# Patient Record
Sex: Male | Born: 1945 | Race: Black or African American | Hispanic: No | Marital: Married | Smoking: Former smoker
Health system: Southern US, Community
[De-identification: ages and names within clinical notes are randomized; demographics above are authoritative.]

## PROBLEM LIST (undated history)

## (undated) DIAGNOSIS — C140 Malignant neoplasm of pharynx, unspecified: Secondary | ICD-10-CM

## (undated) DIAGNOSIS — E119 Type 2 diabetes mellitus without complications: Secondary | ICD-10-CM

## (undated) DIAGNOSIS — J439 Emphysema, unspecified: Secondary | ICD-10-CM

## (undated) DIAGNOSIS — I10 Essential (primary) hypertension: Secondary | ICD-10-CM

---

## 2014-03-28 ENCOUNTER — Inpatient Hospital Stay (HOSPITAL_BASED_OUTPATIENT_CLINIC_OR_DEPARTMENT_OTHER)
Admission: EM | Admit: 2014-03-28 | Discharge: 2014-04-04 | DRG: 871 | Disposition: A | Discharge status: Home / Self Care | Payer: Medicare Other | Attending: Emergency Medicine | Admitting: Emergency Medicine

## 2014-03-28 ENCOUNTER — Encounter (HOSPITAL_BASED_OUTPATIENT_CLINIC_OR_DEPARTMENT_OTHER): Payer: Self-pay | Admitting: Emergency Medicine

## 2014-03-28 DIAGNOSIS — J9690 Respiratory failure, unspecified, unspecified whether with hypoxia or hypercapnia: Secondary | ICD-10-CM | POA: Diagnosis present

## 2014-03-28 DIAGNOSIS — Z23 Encounter for immunization: Secondary | ICD-10-CM

## 2014-03-28 DIAGNOSIS — E860 Dehydration: Secondary | ICD-10-CM | POA: Diagnosis present

## 2014-03-28 DIAGNOSIS — J9692 Respiratory failure, unspecified with hypercapnia: Secondary | ICD-10-CM

## 2014-03-28 DIAGNOSIS — J441 Chronic obstructive pulmonary disease with (acute) exacerbation: Secondary | ICD-10-CM | POA: Diagnosis present

## 2014-03-28 DIAGNOSIS — J439 Emphysema, unspecified: Secondary | ICD-10-CM

## 2014-03-28 DIAGNOSIS — Z87891 Personal history of nicotine dependence: Secondary | ICD-10-CM

## 2014-03-28 DIAGNOSIS — R0602 Shortness of breath: Secondary | ICD-10-CM | POA: Diagnosis not present

## 2014-03-28 DIAGNOSIS — R6520 Severe sepsis without septic shock: Secondary | ICD-10-CM

## 2014-03-28 DIAGNOSIS — E1169 Type 2 diabetes mellitus with other specified complication: Secondary | ICD-10-CM | POA: Diagnosis present

## 2014-03-28 DIAGNOSIS — A419 Sepsis, unspecified organism: Secondary | ICD-10-CM | POA: Diagnosis not present

## 2014-03-28 DIAGNOSIS — Z85819 Personal history of malignant neoplasm of unspecified site of lip, oral cavity, and pharynx: Secondary | ICD-10-CM

## 2014-03-28 DIAGNOSIS — I498 Other specified cardiac arrhythmias: Secondary | ICD-10-CM | POA: Diagnosis present

## 2014-03-28 DIAGNOSIS — N179 Acute kidney failure, unspecified: Secondary | ICD-10-CM | POA: Diagnosis not present

## 2014-03-28 DIAGNOSIS — D649 Anemia, unspecified: Secondary | ICD-10-CM | POA: Diagnosis present

## 2014-03-28 DIAGNOSIS — E872 Acidosis, unspecified: Secondary | ICD-10-CM | POA: Diagnosis present

## 2014-03-28 DIAGNOSIS — J852 Abscess of lung without pneumonia: Secondary | ICD-10-CM | POA: Diagnosis present

## 2014-03-28 DIAGNOSIS — J9621 Acute and chronic respiratory failure with hypoxia: Secondary | ICD-10-CM

## 2014-03-28 DIAGNOSIS — J9691 Respiratory failure, unspecified with hypoxia: Secondary | ICD-10-CM

## 2014-03-28 DIAGNOSIS — J9620 Acute and chronic respiratory failure, unspecified whether with hypoxia or hypercapnia: Secondary | ICD-10-CM | POA: Diagnosis present

## 2014-03-28 DIAGNOSIS — T380X5A Adverse effect of glucocorticoids and synthetic analogues, initial encounter: Secondary | ICD-10-CM | POA: Diagnosis present

## 2014-03-28 DIAGNOSIS — G9340 Encephalopathy, unspecified: Secondary | ICD-10-CM | POA: Diagnosis present

## 2014-03-28 DIAGNOSIS — J9601 Acute respiratory failure with hypoxia: Secondary | ICD-10-CM

## 2014-03-28 DIAGNOSIS — J189 Pneumonia, unspecified organism: Secondary | ICD-10-CM

## 2014-03-28 DIAGNOSIS — E861 Hypovolemia: Secondary | ICD-10-CM | POA: Diagnosis not present

## 2014-03-28 DIAGNOSIS — I10 Essential (primary) hypertension: Secondary | ICD-10-CM | POA: Diagnosis present

## 2014-03-28 HISTORY — DX: Essential (primary) hypertension: I10

## 2014-03-28 HISTORY — DX: Malignant neoplasm of pharynx, unspecified: C14.0

## 2014-03-28 HISTORY — DX: Type 2 diabetes mellitus without complications: E11.9

## 2014-03-28 HISTORY — DX: Emphysema, unspecified: J43.9

## 2014-03-28 NOTE — ED Provider Notes (Addendum)
CSN: [id number]     Arrival date & time 03/28/14  2332 History  This chart was scribed for Varney Biles, MD by Einar Pheasant, ED Scribe. This patient was seen in room MH10/MH10 and the patient's care was started at 11:47 PM.    Chief Complaint  Patient presents with  . Shortness of Breath   The history is provided by the patient. No language interpreter was used.   HPI Comments:  Brent Powers is a 68 y.o. male with hx of DM, COPD, HTN presents to the Emergency Department complaining of gradual onset SOB that started 1 week ago. Pt reports using oxygen at night DUE TO HIS advanced Emphysema. He is also endorsing a productive cough with brown colored sputum. Pt states that he has no trouble getting around at home, but today he noticed that it was hard to just get to the door. He states that lately he has not been able to sleep flat, secondary to the DIB. Pt is here visiting from [place], [place]. Denies any hx of blood blots, hx of CA, palpitations, chest pain, or sick contacts. He denies using a cpap machine or having intubations.  At arrival, pt had O2 sats in the 40s, which improved to 60s on 3 Liters O2 and eventually 100% on NRB.   Past Medical History  Diagnosis Date  . Emphysema lung   . Diabetes type 2, controlled   . Hypertension    History reviewed. No pertinent past surgical history. History reviewed. No pertinent family history. History  Substance Use Topics  . Smoking status: Former Research scientist (life sciences)  . Smokeless tobacco: Not on file  . Alcohol Use: No    Review of Systems  Constitutional: Negative for fever and chills.  HENT: Negative for rhinorrhea, sinus pressure, sneezing and sore throat.   Respiratory: Positive for cough and shortness of breath.   Cardiovascular: Negative for chest pain and palpitations.  Gastrointestinal: Negative for nausea and vomiting.  Musculoskeletal: Negative for myalgias.  Skin: Negative for rash.  Allergic/Immunologic: Negative for  immunocompromised state.  Neurological: Negative for weakness.  Psychiatric/Behavioral: Negative for confusion.   Allergies  Review of patient's allergies indicates no known allergies.  Home Medications   Prior to Admission medications   Medication Sig Start Date End Date Taking? Authorizing Provider  amoxicillin (AMOXIL) 500 MG capsule Take 1,000 mg by mouth 2 (two) times daily.   Yes Historical Provider, MD  Fluticasone-Salmeterol (ADVAIR) 250-50 MCG/DOSE AEPB Inhale 1 puff into the lungs 2 (two) times daily.   Yes Historical Provider, MD  levocetirizine (XYZAL) 5 MG tablet Take 5 mg by mouth every evening.   Yes Historical Provider, MD  lisinopril (PRINIVIL,ZESTRIL) 10 MG tablet Take 10 mg by mouth daily.   Yes Historical Provider, MD  metFORMIN (GLUCOPHAGE) 1000 MG tablet Take 1,000 mg by mouth 2 (two) times daily with a meal.   Yes Historical Provider, MD  tiotropium (SPIRIVA) 18 MCG inhalation capsule Place 18 mcg into inhaler and inhale daily.   Yes Historical Provider, MD   Triage Vitals:BP 151/78  Pulse 143  Temp(Src) 99.2 F (37.3 C) (Oral)  Resp 44  Ht 6' (1.829 m)  Wt 230 lb (104.327 kg)  BMI 31.19 kg/m2  SpO2 55%   Physical Exam  Nursing note and vitals reviewed. Constitutional: He is oriented to person, place, and time. He appears well-developed and well-nourished. No distress.  HENT:  Head: Normocephalic and atraumatic.  Eyes: Conjunctivae and EOM are normal.  Neck: Neck supple. No  tracheal deviation present.  Cardiovascular: Regular rhythm.  Tachycardia present.   Pulmonary/Chest: He is in respiratory distress ( mild). He has no wheezes. He has no rales.  Pt is in mild respiratory distress. Mild accessory muscle use. Pt is tachypneic - 28. No wheezes appreciated. No rhonchi. No rales. + poor air entry  Abdominal: Soft. Bowel sounds are normal.  Musculoskeletal: Normal range of motion. He exhibits no edema and no tenderness.  No unilateral leg swelling of calf  tenderness.  Neurological: He is alert and oriented to person, place, and time.  Skin: Skin is warm and dry.  Psychiatric: He has a normal mood and affect. His behavior is normal.    ED Course  Procedures (including critical care time)  DIAGNOSTIC STUDIES: Oxygen Saturation is 55% on RA, low by my interpretation.    COORDINATION OF CARE: 11:48 PM- Pt advised of plan for treatment and pt agrees.  Labs Review Labs Reviewed  CBC WITH DIFFERENTIAL - Abnormal; Notable for the following:    WBC 17.2 (*)    RBC 4.17 (*)    Hemoglobin 12.7 (*)    HCT 36.6 (*)    Neutrophils Relative % 78 (*)    Lymphocytes Relative 4 (*)    Monocytes Relative 2 (*)    Band Neutrophils 16 (*)    Neutro Abs 16.2 (*)    All other components within normal limits  BASIC METABOLIC PANEL - Abnormal; Notable for the following:    Sodium 134 (*)    Chloride 92 (*)    Glucose, Bld 194 (*)    BUN 36 (*)    Creatinine, Ser 1.50 (*)    GFR calc non Af Amer 46 (*)    GFR calc Af Amer 53 (*)    Anion gap 18 (*)    All other components within normal limits  PRO B NATRIURETIC PEPTIDE - Abnormal; Notable for the following:    Pro B Natriuretic peptide (BNP) 1730.0 (*)    All other components within normal limits  URINALYSIS, ROUTINE W REFLEX MICROSCOPIC - Abnormal; Notable for the following:    APPearance CLOUDY (*)    Hgb urine dipstick MODERATE (*)    Ketones, ur 15 (*)    Protein, ur 100 (*)    All other components within normal limits  URINE MICROSCOPIC-ADD ON - Abnormal; Notable for the following:    Bacteria, UA FEW (*)    All other components within normal limits  I-STAT ARTERIAL BLOOD GAS, ED - Abnormal; Notable for the following:    pH, Arterial 7.269 (*)    pCO2 arterial 52.5 (*)    pO2, Arterial 102.0 (*)    Bicarbonate 24.1 (*)    Acid-base deficit 3.0 (*)    All other components within normal limits  I-STAT ARTERIAL BLOOD GAS, ED - Abnormal; Notable for the following:    pH, Arterial  7.229 (*)    pCO2 arterial 57.6 (*)    pO2, Arterial 168.0 (*)    Bicarbonate 24.1 (*)    Acid-base deficit 4.0 (*)    All other components within normal limits  TROPONIN I  BLOOD GAS, ARTERIAL  PATHOLOGIST SMEAR REVIEW  I-STAT CG4 LACTIC ACID, ED    Imaging Review Ct Angio Chest Pe W/cm &/or Wo Cm  03/29/2014   CLINICAL DATA:  Evaluate for PE, parenchymal disease.  EXAM: CT ANGIOGRAPHY CHEST WITH CONTRAST  TECHNIQUE: Multidetector CT imaging of the chest was performed using the standard protocol during bolus administration  of intravenous contrast. Multiplanar CT image reconstructions and MIPs were obtained to evaluate the vascular anatomy.  CONTRAST:  53mL OMNIPAQUE IOHEXOL 350 MG/ML SOLN  COMPARISON:  Prior radiograph from earlier the same day.  FINDINGS: Thyroid gland within normal limits.  Enlarged 1.5 cm AE recess node present (series 4, image 39). Precarinal node measures 1 cm (series 4, image 43). No hilar or axillary adenopathy.  Intrathoracic aorta of normal caliber. Great vessels within normal limits.  Heart size normal.  No pericardial effusion.  Pulmonary arterial tree is well opacified. No filling defect to suggest acute pulmonary embolism identified. Re-formatted imaging confirms these findings. Please note that evaluation for possible small distal emboli somewhat limited due to respiratory motion artifact on this exam.  Severe upper lobe predominant emphysema present. There is superimposed extensive parenchymal infiltrate throughout the left upper lobe, greatest within the left lung apex. Findings are most consistent with pneumonia. No cavitation. The left lower lobe and right lung are clear. No pleural effusion. No pneumothorax. No worrisome pulmonary nodule or mass.  Visualized upper abdomen is within normal limits.  No acute osseous abnormality. No worrisome lytic or blastic osseous lesions. Gynecomastia noted.  IMPRESSION: 1. No CT evidence of acute pulmonary embolism. Please note  that evaluation of the distal segmental pulmonary arteries is fairly limited on this exam due to extensive respiratory motion artifact. 2. Severe emphysema with superimposed left upper lobe pneumonia. No cavitary changes to suggest post - primary tuberculous infection, although this is not entirely excluded. 3. Mediastinal adenopathy is above, likely reactive in nature. Results were discussed by telephone at the time of interpretation on 03/29/2014 at 3:35 am to Dr. Varney Biles , who verbally acknowledged these results.   Electronically Signed   By: Jeannine Boga M.D.   On: 03/29/2014 03:34   Dg Chest Portable 1 View  03/29/2014   CLINICAL DATA:  Shortness of breath. History of emphysema, diabetes. History of smoking.  EXAM: PORTABLE CHEST - 1 VIEW  COMPARISON:  None.  FINDINGS: Heart size is within normal limits accounting for the portable position of the patient. There is diffuse interstitial infiltrate throughout the left lung. More focal opacity at the left apex is noted. Right lung is essentially clear. No pulmonary edema.  IMPRESSION: Interstitial infiltrate throughout the left lung with left apical confluence. Tuberculosis needs to be excluded. Consider CT the chest with contrast for further evaluation.  The findings were discussed with Abi Shoults on 03/29/2014 at 1:30 am.   Electronically Signed   By: Shon Hale M.D.   On: 03/29/2014 01:31     EKG Interpretation   Date/Time:  Thursday March 29 2014 00:32:31 EDT Ventricular Rate:  144 PR Interval:  142 QRS Duration: 80 QT Interval:  248 QTC Calculation: 383 R Axis:   -36 Text Interpretation:  Sinus tachycardia Left axis deviation Left  ventricular hypertrophy Nonspecific ST abnormality Abnormal ECG poor r  wave progression Confirmed by Kathrynn Humble, MD, [facility] [phone]) on 03/29/2014  1:29:01 AM      MDM   Final diagnoses:  Respiratory failure with hypoxia and hypercapnia  Pulmonary emphysema, unspecified emphysema type   CAP (community acquired pneumonia)  Severe sepsis    I personally performed the services described in this documentation, which was scribed in my presence. The recorded information has been reviewed and is accurate.   Pt comes in with hypoxic respiratory failure. Hx of COPD, DM. No home O2, no CAD, CHF hx. Pt has some left sided opacity - which  the radiology team reveals is likely interstitial disease. They is also some apical confluence, and thus CT is recommended.  4:02 AM Pt has been given zopimex, solumedrol. He was saturating well on NRB, however, we had placed hi on bipap for work of breathing, initially at minimal setting bipap 10/5, with ABG showing respiratory acidosis, PCO2 of 52.5.  Post CT, repeat ABG done - and shows interval worsening of the pH and the PCO2, and so patient increased to setting of 14/5.  CT scan show PNA. No admission in the last 3 months. Levaquin 500 mg started. Severe sepsis by definition.  Spoke with Dr. Wilhelmina Mcardle, ICU -they recommend increasing the EPAP. Pt will be going to ICU. Pt is still arousable, and responds to our query appropriately. He is full code.  Family informed and aware of the condition.  CRITICAL CARE Performed by: Varney Biles   Total critical care time: 90 minutes  Critical care time was exclusive of separately billable procedures and treating other patients.  Critical care was necessary to treat or prevent imminent or life-threatening deterioration.  Critical care was time spent personally by me on the following activities: development of treatment plan with patient and/or surrogate as well as nursing, discussions with consultants, evaluation of patient's response to treatment, examination of patient, obtaining history from patient or surrogate, ordering and performing treatments and interventions, ordering and review of laboratory studies, ordering and review of radiographic studies, pulse oximetry and re-evaluation of  patient's condition.    Varney Biles, MD 03/29/14 0409  4:57 AM Pt more alert. [facility] here to pick up. Family's questions answered.  Varney Biles, MD 03/29/14 [phone]

## 2014-03-28 NOTE — ED Notes (Signed)
Patient states that he has had SOB x 1 week. The patient reports that tonight it has worsened. Has a hx. Of emphysemia. Had chest tightness last night with the SOB

## 2014-03-28 NOTE — ED Notes (Signed)
C/o sob x 1 week  Worse last 2 days

## 2014-03-29 ENCOUNTER — Emergency Department (HOSPITAL_BASED_OUTPATIENT_CLINIC_OR_DEPARTMENT_OTHER): Payer: Medicare Other

## 2014-03-29 DIAGNOSIS — E872 Acidosis, unspecified: Secondary | ICD-10-CM | POA: Diagnosis present

## 2014-03-29 DIAGNOSIS — Z87891 Personal history of nicotine dependence: Secondary | ICD-10-CM | POA: Diagnosis not present

## 2014-03-29 DIAGNOSIS — Z23 Encounter for immunization: Secondary | ICD-10-CM | POA: Diagnosis not present

## 2014-03-29 DIAGNOSIS — J189 Pneumonia, unspecified organism: Secondary | ICD-10-CM

## 2014-03-29 DIAGNOSIS — T380X5A Adverse effect of glucocorticoids and synthetic analogues, initial encounter: Secondary | ICD-10-CM | POA: Diagnosis present

## 2014-03-29 DIAGNOSIS — G9340 Encephalopathy, unspecified: Secondary | ICD-10-CM | POA: Diagnosis present

## 2014-03-29 DIAGNOSIS — I498 Other specified cardiac arrhythmias: Secondary | ICD-10-CM | POA: Diagnosis present

## 2014-03-29 DIAGNOSIS — Z85819 Personal history of malignant neoplasm of unspecified site of lip, oral cavity, and pharynx: Secondary | ICD-10-CM | POA: Diagnosis not present

## 2014-03-29 DIAGNOSIS — R0602 Shortness of breath: Secondary | ICD-10-CM | POA: Diagnosis present

## 2014-03-29 DIAGNOSIS — A419 Sepsis, unspecified organism: Secondary | ICD-10-CM

## 2014-03-29 DIAGNOSIS — J9600 Acute respiratory failure, unspecified whether with hypoxia or hypercapnia: Secondary | ICD-10-CM

## 2014-03-29 DIAGNOSIS — N179 Acute kidney failure, unspecified: Secondary | ICD-10-CM | POA: Diagnosis not present

## 2014-03-29 DIAGNOSIS — R6520 Severe sepsis without septic shock: Secondary | ICD-10-CM

## 2014-03-29 DIAGNOSIS — D649 Anemia, unspecified: Secondary | ICD-10-CM | POA: Diagnosis present

## 2014-03-29 DIAGNOSIS — E860 Dehydration: Secondary | ICD-10-CM | POA: Diagnosis present

## 2014-03-29 DIAGNOSIS — E1169 Type 2 diabetes mellitus with other specified complication: Secondary | ICD-10-CM | POA: Diagnosis present

## 2014-03-29 DIAGNOSIS — E861 Hypovolemia: Secondary | ICD-10-CM | POA: Diagnosis not present

## 2014-03-29 DIAGNOSIS — I10 Essential (primary) hypertension: Secondary | ICD-10-CM | POA: Diagnosis present

## 2014-03-29 DIAGNOSIS — J438 Other emphysema: Secondary | ICD-10-CM

## 2014-03-29 DIAGNOSIS — J9690 Respiratory failure, unspecified, unspecified whether with hypoxia or hypercapnia: Secondary | ICD-10-CM | POA: Diagnosis present

## 2014-03-29 DIAGNOSIS — J852 Abscess of lung without pneumonia: Secondary | ICD-10-CM | POA: Diagnosis present

## 2014-03-29 DIAGNOSIS — J9620 Acute and chronic respiratory failure, unspecified whether with hypoxia or hypercapnia: Secondary | ICD-10-CM | POA: Diagnosis present

## 2014-03-29 DIAGNOSIS — J441 Chronic obstructive pulmonary disease with (acute) exacerbation: Secondary | ICD-10-CM | POA: Diagnosis present

## 2014-03-29 LAB — POCT I-STAT 3, ART BLOOD GAS (G3+)
ACID-BASE DEFICIT: 6 mmol/L — AB (ref 0.0–2.0)
BICARBONATE: 20.8 meq/L (ref 20.0–24.0)
O2 Saturation: 95 %
PO2 ART: 82 mmHg (ref 80.0–100.0)
Patient temperature: 97.6
TCO2: 22 mmol/L (ref 0–100)
pCO2 arterial: 44.3 mmHg (ref 35.0–45.0)
pH, Arterial: 7.276 — ABNORMAL LOW (ref 7.350–7.450)

## 2014-03-29 LAB — URINALYSIS, ROUTINE W REFLEX MICROSCOPIC
BILIRUBIN URINE: NEGATIVE
Glucose, UA: NEGATIVE mg/dL
Ketones, ur: 15 mg/dL — AB
Leukocytes, UA: NEGATIVE
Nitrite: NEGATIVE
PROTEIN: 100 mg/dL — AB
Specific Gravity, Urine: 1.017 (ref 1.005–1.030)
UROBILINOGEN UA: 0.2 mg/dL (ref 0.0–1.0)
pH: 5.5 (ref 5.0–8.0)

## 2014-03-29 LAB — EXPECTORATED SPUTUM ASSESSMENT W GRAM STAIN, RFLX TO RESP C: Special Requests: NORMAL

## 2014-03-29 LAB — I-STAT ARTERIAL BLOOD GAS, ED
Acid-base deficit: 3 mmol/L — ABNORMAL HIGH (ref 0.0–2.0)
Acid-base deficit: 4 mmol/L — ABNORMAL HIGH (ref 0.0–2.0)
Bicarbonate: 24.1 mEq/L — ABNORMAL HIGH (ref 20.0–24.0)
Bicarbonate: 24.1 mEq/L — ABNORMAL HIGH (ref 20.0–24.0)
O2 Saturation: 97 %
O2 Saturation: 99 %
TCO2: 26 mmol/L (ref 0–100)
TCO2: 26 mmol/L (ref 0–100)
pCO2 arterial: 52.5 mmHg — ABNORMAL HIGH (ref 35.0–45.0)
pCO2 arterial: 57.6 mmHg (ref 35.0–45.0)
pH, Arterial: 7.229 — ABNORMAL LOW (ref 7.350–7.450)
pH, Arterial: 7.269 — ABNORMAL LOW (ref 7.350–7.450)
pO2, Arterial: 102 mmHg — ABNORMAL HIGH (ref 80.0–100.0)
pO2, Arterial: 168 mmHg — ABNORMAL HIGH (ref 80.0–100.0)

## 2014-03-29 LAB — COMPREHENSIVE METABOLIC PANEL
ALBUMIN: 2.6 g/dL — AB (ref 3.5–5.2)
ALK PHOS: 87 U/L (ref 39–117)
ALT: 39 U/L (ref 0–53)
ANION GAP: 19 — AB (ref 5–15)
AST: 34 U/L (ref 0–37)
BILIRUBIN TOTAL: 0.7 mg/dL (ref 0.3–1.2)
BUN: 24 mg/dL — AB (ref 6–23)
CHLORIDE: 97 meq/L (ref 96–112)
CO2: 19 mEq/L (ref 19–32)
Calcium: 8.8 mg/dL (ref 8.4–10.5)
Creatinine, Ser: 0.87 mg/dL (ref 0.50–1.35)
GFR calc Af Amer: >90 mL/min (ref >90)
GFR calc non Af Amer: 86 mL/min — ABNORMAL LOW (ref >90)
Glucose, Bld: 200 mg/dL — ABNORMAL HIGH (ref 70–99)
Potassium: 4.4 mEq/L (ref 3.7–5.3)
SODIUM: 135 meq/L — AB (ref 137–147)
Total Protein: 6.6 g/dL (ref 6.0–8.3)

## 2014-03-29 LAB — EXPECTORATED SPUTUM ASSESSMENT W REFEX TO RESP CULTURE

## 2014-03-29 LAB — BASIC METABOLIC PANEL
Anion gap: 18 — ABNORMAL HIGH (ref 5–15)
BUN: 36 mg/dL — ABNORMAL HIGH (ref 6–23)
CALCIUM: 10 mg/dL (ref 8.4–10.5)
CO2: 24 meq/L (ref 19–32)
Chloride: 92 mEq/L — ABNORMAL LOW (ref 96–112)
Creatinine, Ser: 1.5 mg/dL — ABNORMAL HIGH (ref 0.50–1.35)
GFR calc Af Amer: 53 mL/min — ABNORMAL LOW (ref >90)
GFR calc non Af Amer: 46 mL/min — ABNORMAL LOW (ref >90)
Glucose, Bld: 194 mg/dL — ABNORMAL HIGH (ref 70–99)
POTASSIUM: 4.3 meq/L (ref 3.7–5.3)
SODIUM: 134 meq/L — AB (ref 137–147)

## 2014-03-29 LAB — CBC WITH DIFFERENTIAL/PLATELET
BLASTS: 0 %
Band Neutrophils: 16 % — ABNORMAL HIGH (ref 0–10)
Basophils Absolute: 0 10*3/uL (ref 0.0–0.1)
Basophils Absolute: 0 10*3/uL (ref 0.0–0.1)
Basophils Relative: 0 % (ref 0–1)
Basophils Relative: 0 % (ref 0–1)
EOS ABS: 0 10*3/uL (ref 0.0–0.7)
EOS PCT: 0 % (ref 0–5)
Eosinophils Absolute: 0 10*3/uL (ref 0.0–0.7)
Eosinophils Relative: 0 % (ref 0–5)
HCT: 33.1 % — ABNORMAL LOW (ref 39.0–52.0)
HCT: 36.6 % — ABNORMAL LOW (ref 39.0–52.0)
HEMOGLOBIN: 11.4 g/dL — AB (ref 13.0–17.0)
Hemoglobin: 12.7 g/dL — ABNORMAL LOW (ref 13.0–17.0)
LYMPHS ABS: 0.2 10*3/uL — AB (ref 0.7–4.0)
LYMPHS PCT: 1 % — AB (ref 12–46)
Lymphocytes Relative: 4 % — ABNORMAL LOW (ref 12–46)
Lymphs Abs: 0.7 10*3/uL (ref 0.7–4.0)
MCH: 29.8 pg (ref 26.0–34.0)
MCH: 30.5 pg (ref 26.0–34.0)
MCHC: 34.4 g/dL (ref 30.0–36.0)
MCHC: 34.7 g/dL (ref 30.0–36.0)
MCV: 86.4 fL (ref 78.0–100.0)
MCV: 87.8 fL (ref 78.0–100.0)
METAMYELOCYTES PCT: 0 %
MONOS PCT: 4 % (ref 3–12)
MYELOCYTES: 0 %
Monocytes Absolute: 0.3 10*3/uL (ref 0.1–1.0)
Monocytes Absolute: 0.9 10*3/uL (ref 0.1–1.0)
Monocytes Relative: 2 % — ABNORMAL LOW (ref 3–12)
NEUTROS ABS: 22.1 10*3/uL — AB (ref 1.7–7.7)
NEUTROS PCT: 78 % — AB (ref 43–77)
NEUTROS PCT: 95 % — AB (ref 43–77)
NRBC: 0 /100{WBCs}
Neutro Abs: 16.2 10*3/uL — ABNORMAL HIGH (ref 1.7–7.7)
PROMYELOCYTES ABS: 0 %
Platelets: 175 10*3/uL (ref 150–400)
Platelets: 193 10*3/uL (ref 150–400)
RBC: 3.83 MIL/uL — AB (ref 4.22–5.81)
RBC: 4.17 MIL/uL — ABNORMAL LOW (ref 4.22–5.81)
RDW: 13.7 % (ref 11.5–15.5)
RDW: 14.1 % (ref 11.5–15.5)
WBC: 17.2 10*3/uL — ABNORMAL HIGH (ref 4.0–10.5)
WBC: 23.2 10*3/uL — AB (ref 4.0–10.5)

## 2014-03-29 LAB — I-STAT CG4 LACTIC ACID, ED: LACTIC ACID, VENOUS: 0.88 mmol/L (ref 0.5–2.2)

## 2014-03-29 LAB — GLUCOSE, CAPILLARY
GLUCOSE-CAPILLARY: 181 mg/dL — AB (ref 70–99)
GLUCOSE-CAPILLARY: 196 mg/dL — AB (ref 70–99)
Glucose-Capillary: 189 mg/dL — ABNORMAL HIGH (ref 70–99)
Glucose-Capillary: 187 mg/dL — ABNORMAL HIGH (ref 70–99)
Glucose-Capillary: 168 mg/dL — ABNORMAL HIGH (ref 70–99)

## 2014-03-29 LAB — TROPONIN I

## 2014-03-29 LAB — PRO B NATRIURETIC PEPTIDE: PRO B NATRI PEPTIDE: 1730 pg/mL — AB (ref 0–125)

## 2014-03-29 LAB — PATHOLOGIST SMEAR REVIEW

## 2014-03-29 LAB — URINE MICROSCOPIC-ADD ON

## 2014-03-29 LAB — STREP PNEUMONIAE URINARY ANTIGEN: STREP PNEUMO URINARY ANTIGEN: NEGATIVE

## 2014-03-29 LAB — PROCALCITONIN: PROCALCITONIN: 5.43 ng/mL

## 2014-03-29 LAB — PROTIME-INR
INR: 1.22 (ref 0.00–1.49)
Prothrombin Time: 15.4 seconds — ABNORMAL HIGH (ref 11.6–15.2)

## 2014-03-29 LAB — PHOSPHORUS: Phosphorus: 3 mg/dL (ref 2.3–4.6)

## 2014-03-29 LAB — LACTIC ACID, PLASMA: Lactic Acid, Venous: 0.9 mmol/L (ref 0.5–2.2)

## 2014-03-29 LAB — MAGNESIUM: Magnesium: 1.9 mg/dL (ref 1.5–2.5)

## 2014-03-29 LAB — MRSA PCR SCREENING: MRSA by PCR: NEGATIVE

## 2014-03-29 MED ORDER — DORZOLAMIDE HCL 2 % OP SOLN
1.0000 [drp] | Freq: Two times a day (BID) | OPHTHALMIC | Status: DC
  Administered 2014-03-29 – 2014-04-04 (×13): 1 [drp] via OPHTHALMIC
  Filled 2014-03-29 (×2): qty 10

## 2014-03-29 MED ORDER — SODIUM CHLORIDE 0.9 % IV SOLN
INTRAVENOUS | Status: DC
  Filled 2014-03-29: qty 2.5

## 2014-03-29 MED ORDER — INSULIN REGULAR HUMAN 100 UNIT/ML IJ SOLN
INTRAMUSCULAR | Status: DC
  Filled 2014-03-29: qty 2.5

## 2014-03-29 MED ORDER — CETYLPYRIDINIUM CHLORIDE 0.05 % MT LIQD
7.0000 mL | Freq: Two times a day (BID) | OROMUCOSAL | Status: DC
  Administered 2014-03-29 – 2014-03-30 (×3): 7 mL via OROMUCOSAL

## 2014-03-29 MED ORDER — PNEUMOCOCCAL VAC POLYVALENT 25 MCG/0.5ML IJ INJ
0.5000 mL | INJECTION | INTRAMUSCULAR | Status: AC
  Administered 2014-03-30: 0.5 mL via INTRAMUSCULAR
  Filled 2014-03-29: qty 0.5

## 2014-03-29 MED ORDER — LEVALBUTEROL HCL 0.63 MG/3ML IN NEBU
0.6300 mg | INHALATION_SOLUTION | Freq: Once | RESPIRATORY_TRACT | Status: AC
  Administered 2014-03-29: 0.63 mg via RESPIRATORY_TRACT
  Filled 2014-03-29: qty 3

## 2014-03-29 MED ORDER — LEVOFLOXACIN IN D5W 750 MG/150ML IV SOLN
750.0000 mg | INTRAVENOUS | Status: DC
  Administered 2014-03-29: 750 mg via INTRAVENOUS
  Filled 2014-03-29 (×2): qty 150

## 2014-03-29 MED ORDER — SODIUM CHLORIDE 0.9 % IV SOLN
INTRAVENOUS | Status: DC
  Administered 2014-03-29: 100 mL/h via INTRAVENOUS
  Administered 2014-03-30: 11:00:00 via INTRAVENOUS

## 2014-03-29 MED ORDER — HEPARIN SODIUM (PORCINE) 5000 UNIT/ML IJ SOLN
5000.0000 [IU] | Freq: Three times a day (TID) | INTRAMUSCULAR | Status: DC
  Administered 2014-03-29 – 2014-04-03 (×15): 5000 [IU] via SUBCUTANEOUS
  Filled 2014-03-29 (×23): qty 1

## 2014-03-29 MED ORDER — IPRATROPIUM-ALBUTEROL 0.5-2.5 (3) MG/3ML IN SOLN: 3.0000 mL | RESPIRATORY_TRACT | Status: DC

## 2014-03-29 MED ORDER — ACETAMINOPHEN 325 MG PO TABS
650.0000 mg | ORAL_TABLET | ORAL | Status: DC | PRN
Start: 2014-03-29 — End: 2014-04-04
  Administered 2014-03-31 – 2014-04-03 (×3): 650 mg via ORAL
  Filled 2014-03-29 (×3): qty 2

## 2014-03-29 MED ORDER — SODIUM CHLORIDE 0.9 % IV SOLN
Freq: Once | INTRAVENOUS | Status: AC
  Administered 2014-03-29: 500 mL via INTRAVENOUS

## 2014-03-29 MED ORDER — IPRATROPIUM-ALBUTEROL 0.5-2.5 (3) MG/3ML IN SOLN
3.0000 mL | RESPIRATORY_TRACT | Status: DC
  Administered 2014-03-29 – 2014-04-01 (×21): 3 mL via RESPIRATORY_TRACT
  Filled 2014-03-29 (×21): qty 3

## 2014-03-29 MED ORDER — IPRATROPIUM BROMIDE 0.02 % IN SOLN
0.5000 mg | Freq: Once | RESPIRATORY_TRACT | Status: AC
  Administered 2014-03-29: 0.5 mg via RESPIRATORY_TRACT
  Filled 2014-03-29: qty 2.5

## 2014-03-29 MED ORDER — METHYLPREDNISOLONE SODIUM SUCC 125 MG IJ SOLR
125.0000 mg | Freq: Once | INTRAMUSCULAR | Status: AC
  Administered 2014-03-29: 125 mg via INTRAVENOUS
  Filled 2014-03-29: qty 2

## 2014-03-29 MED ORDER — SODIUM CHLORIDE 0.9 % IV SOLN: 250.0000 mL | INTRAVENOUS | Status: DC | PRN

## 2014-03-29 MED ORDER — ASPIRIN 81 MG PO CHEW: 324.0000 mg | CHEWABLE_TABLET | ORAL | Status: AC

## 2014-03-29 MED ORDER — VANCOMYCIN HCL IN DEXTROSE 1-5 GM/200ML-% IV SOLN
1000.0000 mg | Freq: Three times a day (TID) | INTRAVENOUS | Status: DC
  Administered 2014-03-29 – 2014-03-31 (×5): 1000 mg via INTRAVENOUS
  Filled 2014-03-29 (×7): qty 200

## 2014-03-29 MED ORDER — SODIUM CHLORIDE 0.9 % IV BOLUS (SEPSIS)
1000.0000 mL | Freq: Once | INTRAVENOUS | Status: AC
  Administered 2014-03-29: 1000 mL via INTRAVENOUS

## 2014-03-29 MED ORDER — SODIUM CHLORIDE 0.9 % IV SOLN: INTRAVENOUS | Status: DC

## 2014-03-29 MED ORDER — BUDESONIDE 0.25 MG/2ML IN SUSP
0.2500 mg | Freq: Two times a day (BID) | RESPIRATORY_TRACT | Status: AC
  Administered 2014-03-29 – 2014-03-31 (×6): 0.25 mg via RESPIRATORY_TRACT
  Filled 2014-03-29 (×7): qty 2

## 2014-03-29 MED ORDER — PREDNISONE 20 MG PO TABS
40.0000 mg | ORAL_TABLET | Freq: Every day | ORAL | Status: AC
  Administered 2014-03-29 – 2014-04-02 (×5): 40 mg via ORAL
  Filled 2014-03-29 (×6): qty 2

## 2014-03-29 MED ORDER — CHLORHEXIDINE GLUCONATE 0.12 % MT SOLN
15.0000 mL | Freq: Two times a day (BID) | OROMUCOSAL | Status: DC
  Administered 2014-03-29 – 2014-03-30 (×3): 15 mL via OROMUCOSAL
  Filled 2014-03-29 (×2): qty 15

## 2014-03-29 MED ORDER — IPRATROPIUM-ALBUTEROL 0.5-2.5 (3) MG/3ML IN SOLN: 3.0000 mL | Freq: Four times a day (QID) | RESPIRATORY_TRACT | Status: DC

## 2014-03-29 MED ORDER — LEVOFLOXACIN IN D5W 500 MG/100ML IV SOLN
500.0000 mg | Freq: Once | INTRAVENOUS | Status: AC
  Administered 2014-03-29: 500 mg via INTRAVENOUS
  Filled 2014-03-29: qty 100

## 2014-03-29 MED ORDER — INFLUENZA VAC SPLIT QUAD 0.5 ML IM SUSY
0.5000 mL | PREFILLED_SYRINGE | INTRAMUSCULAR | Status: AC
  Administered 2014-03-30: 0.5 mL via INTRAMUSCULAR
  Filled 2014-03-29: qty 0.5

## 2014-03-29 MED ORDER — PIPERACILLIN-TAZOBACTAM 3.375 G IVPB
3.3750 g | Freq: Three times a day (TID) | INTRAVENOUS | Status: DC
  Administered 2014-03-29 – 2014-04-02 (×13): 3.375 g via INTRAVENOUS
  Filled 2014-03-29 (×16): qty 50

## 2014-03-29 MED ORDER — VANCOMYCIN HCL 10 G IV SOLR
1500.0000 mg | Freq: Once | INTRAVENOUS | Status: AC
  Administered 2014-03-29: 1500 mg via INTRAVENOUS
  Filled 2014-03-29: qty 1500

## 2014-03-29 MED ORDER — ASPIRIN 300 MG RE SUPP
300.0000 mg | RECTAL | Status: AC
  Administered 2014-03-29: 300 mg via RECTAL
  Filled 2014-03-29: qty 1

## 2014-03-29 MED ORDER — INSULIN ASPART 100 UNIT/ML ~~LOC~~ SOLN
2.0000 [IU] | SUBCUTANEOUS | Status: DC
  Administered 2014-03-29 (×4): 4 [IU] via SUBCUTANEOUS

## 2014-03-29 MED ORDER — IOHEXOL 350 MG/ML SOLN
80.0000 mL | Freq: Once | INTRAVENOUS | Status: AC | PRN
  Administered 2014-03-29: 80 mL via INTRAVENOUS

## 2014-03-29 MED ORDER — PIPERACILLIN-TAZOBACTAM 3.375 G IVPB 30 MIN
3.3750 g | Freq: Once | INTRAVENOUS | Status: AC
  Administered 2014-03-29: 3.375 g via INTRAVENOUS
  Filled 2014-03-29: qty 50

## 2014-03-29 NOTE — Progress Notes (Signed)
69yo male c/o SOB and productive cough, found to be profoundly hypoxic and acidemic, has COPD, CT reveals emphysema w/ superimposed LUL PNA, to begin IV ABX.  Will start Zosyn 3.375g IV Q8H for CrCl ~35ml/min and monitor CBC and Cx.  Wynona Neat, PharmD, BCPS 03/29/2014 6:16 AM

## 2014-03-29 NOTE — Progress Notes (Signed)
Patient removed from BiPAP and placed on a partial non rebreather to go for a CT scan.  When patient returned an ABG was drawn.  Patient was placed back on BiPAP and IPAP was increased to 14.   IPAP 14, EPAP 5, RR 4, Ti  1.2, FIO2 40%.

## 2014-03-29 NOTE — H&P (Signed)
PULMONARY / CRITICAL CARE MEDICINE   Name: Brent Powers MRN: [id number] DOB: 23-Jan-1946    ADMISSION DATE:  03/28/2014 CONSULTATION DATE:  03/28/2014  REFERRING MD :  [facility]  CHIEF COMPLAINT:  Dyspnea  INITIAL PRESENTATION: 68 yo male with COPD on nocturnal O2 presented to ED 9/16 with gradual onset SOB and productive cough. In ED was profoundly hypoxic and acidemic. He was placed on BiPAP with little improvement in ABG. He was transferred to [facility] for ICU admission and PCCM eval.   STUDIES:  9/17 CTA chest > no PE, severe emphysema, no cavitation suggestive of TB, extensive LUL infiltrate  SIGNIFICANT EVENTS: 9/17 admitted to ICU on BiPAP  SUBJECTIVE:  bipap taken off, then failed rapidly  VITAL SIGNS: Temp:  [97.6 F (36.4 C)-100.5 F (38.1 C)] 98 F (36.7 C) (09/17 1200) Pulse Rate:  [97-153] 99 (09/17 1203) Resp:  [20-44] 23 (09/17 1203) BP: (121-178)/(73-99) 129/81 mmHg (09/17 1203) SpO2:  [50 %-100 %] 95 % (09/17 1203) FiO2 (%):  [40 %] 40 % (09/17 1200) Weight:  [91.4 kg (201 lb 8 oz)-104.327 kg (230 lb)] 91.4 kg (201 lb 8 oz) (09/17 0630) HEMODYNAMICS:   VENTILATOR SETTINGS: Vent Mode:  [-]  FiO2 (%):  [40 %] 40 % INTAKE / OUTPUT:  Intake/Output Summary (Last 24 hours) at 03/29/14 1243 Last data filed at 03/29/14 1216  Gross per 24 hour  Intake 2512.5 ml  Output   1150 ml  Net 1362.5 ml    PHYSICAL EXAMINATION: General:  Off bipap, failed, back on more comfortable Neuro:  Alert, oriented HEENT:  [place]/AT, PERRL, no JVD noted Cardiovascular:  Tachy resolved, S1 s 2 Lungs:  diminished Abdomen:  Soft, non-tender, non-distended Musculoskeletal:  No acute deformity, no edema. +2 peripheral pulses.  Skin:  Intact, MMM  LABS:  CBC  Recent Labs Lab 03/29/14 0001 03/29/14 0743  WBC 17.2* 23.2*  HGB 12.7* 11.4*  HCT 36.6* 33.1*  PLT 193 175   Coag's  Recent Labs Lab 03/29/14 0743  INR 1.22   BMET  Recent Labs Lab 03/29/14 0001  03/29/14 0743  NA 134* 135*  K 4.3 4.4  CL 92* 97  CO2 24 19  BUN 36* 24*  CREATININE 1.50* 0.87  GLUCOSE 194* 200*   Electrolytes  Recent Labs Lab 03/29/14 0001 03/29/14 0743  CALCIUM 10.0 8.8  MG  --  1.9  PHOS  --  3.0   Sepsis Markers  Recent Labs Lab 03/29/14 0141 03/29/14 0743  LATICACIDVEN 0.88 0.9  PROCALCITON  --  5.43   ABG  Recent Labs Lab 03/29/14 0013 03/29/14 0241 03/29/14 0633  PHART 7.269* 7.229* 7.276*  PCO2ART 52.5* 57.6* 44.3  PO2ART 102.0* 168.0* 82.0   Liver Enzymes  Recent Labs Lab 03/29/14 0743  AST 34  ALT 39  ALKPHOS 87  BILITOT 0.7  ALBUMIN 2.6*   Cardiac Enzymes  Recent Labs Lab 03/29/14 0001  TROPONINI <0.30  PROBNP 1730.0*   Glucose  Recent Labs Lab 03/29/14 0609 03/29/14 0808 03/29/14 1154  GLUCAP 181* 196* 189*    Imaging No results found.   ASSESSMENT / PLAN:  PULMONARY A: Acute on chronic hypercarbic respiratory failure Severe COPD with evidence of acute exacerbation CAP - LUL; some initial concern on CXR ([facility]) for TB, history and CT chest not compelling for this Concern is necrotizing PNA Consider component of cardiogenic pulmonary edema  P:   Continuous BiPAP required, failed , now need scheduled 4 hr on 20-30 min  off mandatory cycle SpO2 goal 90-95% Follow CXR in am for rapid escalation concerns Recheck ABG Scheduled Nebs (duoneb, budesonide) Hold lasix Prednisone, limit in setting infection as able, may in fact need escalation to IV abg reviewed, repeat in am , ventilation is about the same, increase IPAP 16 to improve volumes Low threshold to ETT if not maintained off NIMV for min 20 min   CARDIOVASCULAR A:   Tachycardia (sinus)- resolved  P:  Continuous cardiac monitoring.  2D echo Metoprolol PRN , dc if bronchospasm worsens No lasix  RENAL A:   AKI  P:   Hydrate Follow Bmet No lasix  GASTROINTESTINAL A:   resp failure  P:   SUP: IV PPI NPO while  requiring BiPAP  HEMATOLOGIC A:   DV prevention  P:  Follow CBC Heparin for VTE ppx  INFECTIOUS A:   CAP - atypical distribution Concern is necrotizing SIRS/Sepsis  P:   BCx2 9/17>>> Sputum 9/17 >>> Urine 9/17 >>> Abx: levaquin 9/17, start date 9/17, day 1/7 Abx: pip/tazo, start date 9/17, day 1/x  Add vanc, at risk mrsa and necrotizing If drops BP or declines, add clinda for toxin concerns pcxr in am  Urine leg, strep  ENDOCRINE A:   DM    P:   SSI  NEUROLOGIC A:   Acute encephalopathy - hypercarbia  P:   RASS goal: 0-1 Monitor May need prn versed / fent on nimv  I have personally obtained a history, examined the patient, evaluated laboratory and imaging results, formulated the assessment and plan and placed orders. CRITICAL CARE: The patient is critically ill with multiple organ systems failure and requires high complexity decision making for assessment and support, frequent evaluation and titration of therapies, application of advanced monitoring technologies and extensive interpretation of multiple databases. Critical Care Time devoted to patient care services described in this note is 30  minutes.   Lavon Paganini. Titus Mould, MD, [place] Pgr: [facility]

## 2014-03-29 NOTE — ED Notes (Signed)
Wife thinks pt has increased confusion,  md notified and in to see pt

## 2014-03-29 NOTE — Progress Notes (Signed)
eLink Physician-Brief Progress Note Patient Name: Brent Powers DOB: 1946/02/21 MRN: [id number]   Date of Service  03/29/2014  HPI/Events of Note  68 y.o. male with hx of DM, COPD, HTN presents to the Emergency Department complaining of gradual onset SOB that started 1 week ago. Pt reports using oxygen at night DUE TO HIS advanced Emphysema. He is also endorsing a productive cough with brown colored sputum. He is visiting from [place], [place].  On arrival to [facility] he was noted to have  O2 sats in the 40s, which improved to 60s on 3 Liters O2 and eventually 100% on NRB. He was transferred to [facility] ICU for further management and possible intubation.   eICU Interventions  1. COPD Exacerbation - secondary to CAP (LUL PNA) - got one dose of solumedrol - cont with levaquin. He has extensive consolidation of the LUL, will add zosyn - BDs - Pulmicort nebs - steroids - Bipap (18/10), may take breaks of the bipap - follow up labs (CBC, BMP, ABG)  2. Hypoxic\hypercapnic respiratory failure - secondary to copd exacerbation - management as above  3. CAP - LUL  - antibiotics, bipap, copd management  4. DM - SSI - hold metformin (elevated Cr)  5. HTN - hold lisinopril - has elevated Cr  6. ARF - secondary to dehydration - IVFs - hold lisinopril     Intervention Category Evaluation Type: New Patient Evaluation  Ambrose Wile 03/29/2014, 6:20 AM

## 2014-03-29 NOTE — Progress Notes (Signed)
Patient SPO2 saturation was 64% on arrival to room.  Patient was placed on 100% non rebreather and his SPO2 increased to 100%.  Patient was then placed on a partial non rebreather with both flaps removed and patients SPO2 remain at 100%.  Patient placed on 50% Ventimask and patients SPO2 was at 98%.  Patient had a ABG drawn on ISTAT on 50% Ventimask.  Patient was placed on BiPAP per MD order.  IPAP 10, EPAP 5, RR 4, Ti 1.00, 40%.  Patient is tachyneic but tolerating BiPAP well.

## 2014-03-29 NOTE — Progress Notes (Signed)
ABG done while patient was on 50 % ventimask

## 2014-03-29 NOTE — ED Notes (Signed)
Pt is more lethargic than on arrival,  Will open eyes and answer questions

## 2014-03-29 NOTE — H&P (Signed)
PULMONARY / CRITICAL CARE MEDICINE   Name: Brent Powers MRN: [id number] DOB: Dec 04, 1945    ADMISSION DATE:  03/28/2014 CONSULTATION DATE:  03/28/2014  REFERRING MD :  [facility]P  CHIEF COMPLAINT:  Dyspnea  INITIAL PRESENTATION: 68 yo male with COPD on nocturnal O2 presented to ED 9/16 with gradual onset SOB and productive cough. In ED was profoundly hypoxic and acidemic. He was placed on BiPAP with little improvement in ABG. He was transferred to [facility] for ICU admission and PCCM eval.   STUDIES:  9/17 CTA chest > no PE, severe emphysema, no cavitation suggestive of TB, extensive LUL infiltrate  SIGNIFICANT EVENTS: 9/17 admitted to ICU on BiPAP  HISTORY OF PRESENT ILLNESS:  68 yo male with hx of DM, COPD (on nocturnal O2), HTN presents to [facility] ED complaining of gradual onset SOB that started 1 week ago. He is also endorsing a productive cough with brown colored sputum. Pt states that he has no trouble getting around at home, but today he noticed that it was hard to just get to the door. He states that lately he has not been able to sleep flat. In ED he was found to be profoundly hypoxic and acidemic. He was placed on supplemental O2 requiring BiPAP to maintain adequate sats. His gas did not improve with BiPAP. He was then transferred to [facility] for further evaluation.   PAST MEDICAL HISTORY :  Past Medical History  Diagnosis Date  . Emphysema lung   . Diabetes type 2, controlled   . Hypertension    History reviewed. No pertinent past surgical history. Prior to Admission medications   Medication Sig Start Date End Date Taking? Authorizing Provider  amoxicillin (AMOXIL) 500 MG capsule Take 1,000 mg by mouth 2 (two) times daily.   Yes Historical Provider, MD  Fluticasone-Salmeterol (ADVAIR) 250-50 MCG/DOSE AEPB Inhale 1 puff into the lungs 2 (two) times daily.   Yes Historical Provider, MD  levocetirizine (XYZAL) 5 MG tablet Take 5 mg by mouth every evening.   Yes Historical Provider, MD   lisinopril (PRINIVIL,ZESTRIL) 10 MG tablet Take 10 mg by mouth daily.   Yes Historical Provider, MD  metFORMIN (GLUCOPHAGE) 1000 MG tablet Take 1,000 mg by mouth 2 (two) times daily with a meal.   Yes Historical Provider, MD  tiotropium (SPIRIVA) 18 MCG inhalation capsule Place 18 mcg into inhaler and inhale daily.   Yes Historical Provider, MD   No Known Allergies  FAMILY HISTORY:  History reviewed. No pertinent family history. SOCIAL HISTORY:  reports that he has quit smoking. He does not have any smokeless tobacco history on file. He reports that he does not drink alcohol or use illicit drugs.  REVIEW OF SYSTEMS:   Bolds are positive  Constitutional: weight loss, gain, night sweats, Fevers, chills, fatigue .  HEENT: headaches, Sore throat, sneezing, nasal congestion, post nasal drip, Difficulty swallowing, Tooth/dental problems, visual complaints visual changes, ear ache CV:  chest pain, radiates: ,Orthopnea, PND, swelling in lower extremities, dizziness, palpitations, syncope.  GI  heartburn, indigestion, abdominal pain, nausea, vomiting, diarrhea, change in bowel habits, loss of appetite, bloody stools.  Resp: cough, productive: brown sputum , hemoptysis, dyspnea, chest pain, pleuritic.  Skin: rash or itching or icterus GU: dysuria, change in color of urine, urgency or frequency. flank pain, hematuria  MS: joint pain or swelling. decreased range of motion  Psych: change in mood or affect. depression or anxiety.  Neuro: difficulty with speech, weakness, numbness, ataxia    SUBJECTIVE:  Feeling better, starting to wake up  VITAL SIGNS: Temp:  [97.6 F (36.4 C)-99.6 F (37.6 C)] 97.6 F (36.4 C) (09/17 0614) Pulse Rate:  [121-153] 125 (09/17 0633) Resp:  [23-44] 32 (09/17 0633) BP: (132-178)/(78-99) 132/78 mmHg (09/17 0630) SpO2:  [50 %-100 %] 98 % (09/17 0633) FiO2 (%):  [40 %] 40 % (09/17 0618) Weight:  [91.4 kg (201 lb 8 oz)-104.327 kg (230 lb)] 91.4 kg (201 lb 8 oz)  (09/17 1884) HEMODYNAMICS:   VENTILATOR SETTINGS: Vent Mode:  [-]  FiO2 (%):  [40 %] 40 % INTAKE / OUTPUT:  Intake/Output Summary (Last 24 hours) at 03/29/14 1660 Last data filed at 03/29/14 0501  Gross per 24 hour  Intake   2000 ml  Output    450 ml  Net   1550 ml    PHYSICAL EXAMINATION: General:  Male of normal body habitus in NAD on BiPAP Neuro:  Alert, oriented, no focal deficit. Mild confusion HEENT:  [place]/AT, PERRL, no JVD noted Cardiovascular:  Tachy (130), regular Lungs:  Respiration even, unlabored on BiPAP, clear breath sounds at present.  Abdomen:  Soft, non-tender, non-distended Musculoskeletal:  No acute deformity, no edema. +2 peripheral pulses.  Skin:  Intact, MMM  LABS:  CBC  Recent Labs Lab 03/29/14 0001  WBC 17.2*  HGB 12.7*  HCT 36.6*  PLT 193   Coag's No results found for this basename: APTT, INR,  in the last 168 hours BMET  Recent Labs Lab 03/29/14 0001  NA 134*  K 4.3  CL 92*  CO2 24  BUN 36*  CREATININE 1.50*  GLUCOSE 194*   Electrolytes  Recent Labs Lab 03/29/14 0001  CALCIUM 10.0   Sepsis Markers  Recent Labs Lab 03/29/14 0141  LATICACIDVEN 0.88   ABG  Recent Labs Lab 03/29/14 0013 03/29/14 0241 03/29/14 0633  PHART 7.269* 7.229* 7.276*  PCO2ART 52.5* 57.6* 44.3  PO2ART 102.0* 168.0* 82.0   Liver Enzymes No results found for this basename: AST, ALT, ALKPHOS, BILITOT, ALBUMIN,  in the last 168 hours Cardiac Enzymes  Recent Labs Lab 03/29/14 0001  TROPONINI <0.30  PROBNP 1730.0*   Glucose  Recent Labs Lab 03/29/14 0609  GLUCAP 181*    Imaging No results found.   ASSESSMENT / PLAN:  PULMONARY A: Acute on chronic hypercarbic respiratory failure Severe COPD with evidence of acute exacerbation CAP - LUL; some initial concern on CXR ([facility]) for TB, history and CT chest not compelling for this Consider component of cardiogenic pulmonary edema  P:   Continuous BiPAP for now > waking up  and can likely have a break from NIPPV soon SpO2 goal 90-95% Follow CXR Recheck ABG Scheduled Nebs (duoneb, budesonide) Consider diuresis depending on clinical course Prednisone  CARDIOVASCULAR A:   Tachycardia (sinus)  P:  Continuous cardiac monitoring.  2D echo Metoprolol PRN   RENAL A:   AKI  P:   Hydrate Follow Bmet  GASTROINTESTINAL A:   No acute issues  P:   SUP: IV PPI NPO while requiring BiPAP  HEMATOLOGIC A:   No acute issues  P:  Follow CBC Heparin for VTE ppx  INFECTIOUS A:   CAP - atypical distribution SIRS/Sepsis  P:   BCx2 9/17>>> Sputum 9/17 >>> Urine 9/17 >>> Abx: levaquin 9/17, start date 9/17, day 1/7 Abx: pip/tazo, start date 9/17, day 1/x Follow WBC and fever curve Suspect should be able to d/c pip/tazo and finish a course of levaquin IF no evidence of cavitating process  on his f/u CXR's  ENDOCRINE A:   DM    P:   Hold metformin until acidosis improved  Will cover with SSI  NEUROLOGIC A:   Acute encephalopathy - hypercarbia  P:   RASS goal: 0-1 Monitor  Georgann Housekeeper, ACNP [facility] Pager [phone] or [phone]   I have personally obtained a history, examined the patient, evaluated laboratory and imaging results, formulated the assessment and plan and placed orders. CRITICAL CARE: The patient is critically ill with multiple organ systems failure and requires high complexity decision making for assessment and support, frequent evaluation and titration of therapies, application of advanced monitoring technologies and extensive interpretation of multiple databases. Critical Care Time devoted to patient care services described in this note is 60  minutes.    Baltazar Apo, MD, PhD 03/29/2014, 7:39 AM [facility] [phone] or if no answer [phone]

## 2014-03-29 NOTE — Progress Notes (Signed)
Utilization review completed. Ziana Heyliger, RN, BSN. 

## 2014-03-29 NOTE — Progress Notes (Signed)
Changed settings to 16/8 with MD permission.

## 2014-03-29 NOTE — Progress Notes (Signed)
Placed pt. On 5L nasal cannula for a 20 min break from the bipap as stated in the order per MD.

## 2014-03-29 NOTE — ED Notes (Signed)
Pt is more alert , sitting w eyes open

## 2014-03-29 NOTE — Progress Notes (Addendum)
ANTIBIOTIC CONSULT NOTE - INITIAL  Pharmacy Consult for Levaquin  Indication: pneumonia  No Known Allergies  Patient Measurements: Height: 6\' 1"  (185.4 cm) Weight: 201 lb 8 oz (91.4 kg) IBW/kg (Calculated) : 79.9  Vital Signs: Temp: 97.6 F (36.4 C) (09/17 0614) Temp src: Axillary (09/17 0614) BP: 141/83 mmHg (09/17 0618) Pulse Rate: 125 (09/17 8563) Intake/Output from previous day: 09/16 0701 - 09/17 0700 In: 2000 [I.V.:2000] Out: 450 [Urine:450] Intake/Output from this shift: Total I/O In: 2000 [I.V.:2000] Out: 450 [Urine:450]  Labs:  Recent Labs  03/29/14 0001  WBC 17.2*  HGB 12.7*  PLT 193  CREATININE 1.50*   Estimated Creatinine Clearance: 52.5 ml/min (by C-G formula based on Cr of 1.5). No results found for this basename: VANCOTROUGH, VANCOPEAK, VANCORANDOM, GENTTROUGH, GENTPEAK, GENTRANDOM, TOBRATROUGH, TOBRAPEAK, TOBRARND, AMIKACINPEAK, AMIKACINTROU, AMIKACIN,  in the last 72 hours   Microbiology: No results found for this or any previous visit (from the past 720 hour(s)).  Medical History: Past Medical History  Diagnosis Date  . Emphysema lung   . Diabetes type 2, controlled   . Hypertension    Assessment: Levaquin for r/o CAP, CrCl ~50-55, WBC 17.2, other labs as above.   Plan:  -Levaquin 750 mg IV q24h -Also on Zosyn, consider DC of one of these agents  -Trend WBC, temp, renal function   Narda Bonds 03/29/2014,6:40 AM    -Add vancomycin 1500 mg IV x1, then 1g/8h -F/u all cultures, renal function, clinical progress -Keep abx broad for now    Hughes Better, PharmD, BCPS Clinical Pharmacist Pager: [phone] 03/29/2014 3:33 PM

## 2014-03-30 ENCOUNTER — Encounter (HOSPITAL_COMMUNITY): Payer: Self-pay | Admitting: *Deleted

## 2014-03-30 DIAGNOSIS — A419 Sepsis, unspecified organism: Secondary | ICD-10-CM | POA: Diagnosis not present

## 2014-03-30 LAB — BLOOD GAS, ARTERIAL
Acid-Base Excess: 0.1 mmol/L (ref 0.0–2.0)
Bicarbonate: 26.2 mEq/L — ABNORMAL HIGH (ref 20.0–24.0)
Delivery systems: POSITIVE
Drawn by: 31101
Expiratory PAP: 8
FIO2: 0.4 %
Inspiratory PAP: 16
Mode: POSITIVE
O2 Saturation: 96.9 %
PCO2 ART: 59.4 mmHg — AB (ref 35.0–45.0)
Patient temperature: 98.6
TCO2: 28.1 mmol/L (ref 0–100)
pH, Arterial: 7.268 — ABNORMAL LOW (ref 7.350–7.450)
pO2, Arterial: 98.2 mmHg (ref 80.0–100.0)

## 2014-03-30 LAB — BASIC METABOLIC PANEL
Anion gap: 11 (ref 5–15)
BUN: 21 mg/dL (ref 6–23)
CHLORIDE: 102 meq/L (ref 96–112)
CO2: 27 meq/L (ref 19–32)
CREATININE: 0.7 mg/dL (ref 0.50–1.35)
Calcium: 8.9 mg/dL (ref 8.4–10.5)
GFR calc Af Amer: >90 mL/min (ref >90)
GFR calc non Af Amer: >90 mL/min (ref >90)
GLUCOSE: 179 mg/dL — AB (ref 70–99)
Potassium: 4.4 mEq/L (ref 3.7–5.3)
Sodium: 140 mEq/L (ref 137–147)

## 2014-03-30 LAB — GLUCOSE, CAPILLARY
GLUCOSE-CAPILLARY: 190 mg/dL — AB (ref 70–99)
GLUCOSE-CAPILLARY: 247 mg/dL — AB (ref 70–99)
Glucose-Capillary: 143 mg/dL — ABNORMAL HIGH (ref 70–99)
Glucose-Capillary: 148 mg/dL — ABNORMAL HIGH (ref 70–99)
Glucose-Capillary: 138 mg/dL — ABNORMAL HIGH (ref 70–99)
Glucose-Capillary: 164 mg/dL — ABNORMAL HIGH (ref 70–99)

## 2014-03-30 LAB — CBC
HCT: 31.7 % — ABNORMAL LOW (ref 39.0–52.0)
Hemoglobin: 10.8 g/dL — ABNORMAL LOW (ref 13.0–17.0)
MCH: 29.8 pg (ref 26.0–34.0)
MCHC: 34.1 g/dL (ref 30.0–36.0)
MCV: 87.6 fL (ref 78.0–100.0)
Platelets: 192 10*3/uL (ref 150–400)
RBC: 3.62 MIL/uL — ABNORMAL LOW (ref 4.22–5.81)
RDW: 14.7 % (ref 11.5–15.5)
WBC: 19.4 10*3/uL — ABNORMAL HIGH (ref 4.0–10.5)

## 2014-03-30 LAB — URINE CULTURE
COLONY COUNT: NO GROWTH
Culture: NO GROWTH

## 2014-03-30 LAB — LEGIONELLA ANTIGEN, URINE: LEGIONELLA ANTIGEN, URINE: NEGATIVE

## 2014-03-30 MED ORDER — INSULIN ASPART 100 UNIT/ML ~~LOC~~ SOLN: 1.0000 [IU] | SUBCUTANEOUS | Status: DC

## 2014-03-30 MED ORDER — INSULIN ASPART 100 UNIT/ML ~~LOC~~ SOLN
2.0000 [IU] | SUBCUTANEOUS | Status: DC
  Administered 2014-03-30: 4 [IU] via SUBCUTANEOUS
  Administered 2014-03-30 (×2): 2 [IU] via SUBCUTANEOUS
  Administered 2014-03-30: 4 [IU] via SUBCUTANEOUS
  Administered 2014-03-30: 6 [IU] via SUBCUTANEOUS
  Administered 2014-03-30: 2 [IU] via SUBCUTANEOUS
  Administered 2014-03-31 (×2): 4 [IU] via SUBCUTANEOUS
  Administered 2014-03-31 (×2): 6 [IU] via SUBCUTANEOUS
  Administered 2014-03-31 – 2014-04-01 (×3): 4 [IU] via SUBCUTANEOUS
  Administered 2014-04-01: 2 [IU] via SUBCUTANEOUS
  Administered 2014-04-01: 6 [IU] via SUBCUTANEOUS
  Administered 2014-04-02: 2 [IU] via SUBCUTANEOUS

## 2014-03-30 NOTE — Progress Notes (Signed)
Critical pCO2 59, ELink notified.

## 2014-03-30 NOTE — Progress Notes (Signed)
Placed pt. Back on bipap. 

## 2014-03-30 NOTE — Care Management Note (Addendum)
    Page 1 of 2   04/03/2014     4:26:46 PM CARE MANAGEMENT NOTE 04/03/2014  Patient:  Brent Powers, Brent Powers   Account Number:  [id number]  Date Initiated:  03/30/2014  Documentation initiated by:  AMERSON,JULIE  Subjective/Objective Assessment:   Pt adm on 9/16 with PNA, COPD exacerbation.  PTA, pt lives at home with spouse.     Action/Plan:   Will follow for dc needs as pt progresses.   Anticipated DC Date:  04/03/2014   Anticipated DC Plan:  [place]  CM consult      [facility] Choice  DURABLE MEDICAL EQUIPMENT   Choice offered to / List presented to:  C-1 Patient   DME arranged  OXYGEN      DME agency  [facility]        Status of service:  Completed, signed off Medicare Important Message given?  YES (If response is "NO", the following Medicare IM given date fields will be blank) Date Medicare IM given:  04/02/2014 Medicare IM given by:  GRAVES-BIGELOW,Tenesia Escudero Date Additional Medicare IM given:   Additional Medicare IM given by:    Discharge Disposition:  HOME/SELF CARE  Per UR Regulation:  Reviewed for med. necessity/level of care/duration of stay  If discussed at [place] of Stay Meetings, dates discussed:   04/03/2014    Comments:  04-03-14 [address], RN,BSN [phone] Zigmund Daniel did call back from [facility] and they will deliver concentrator to in laws home in am. Pt and manager did clarify how to get concentrator back to [place]. No further needs from CM at this time.   04-03-14 Jacqlyn Krauss, RN BSN [phone] Pt will be staying in [place] until this Sat with his in laws. Pt has a request that a concentrator be delivered to address [address], [place] [address]. [phone]. CM did call Payton Mccallum with [facility] [phone] to see if they could deliver concentrator to [place]. CM was able to speak with the Manager Eleonore Chiquito in reference to assistance with getting a concentrator to  hospital. Pt usually gets 02 from [place]. Pt was able to call [facility] in [place] for 02 tank, however co concerned with losing money on concentrator if they deliver here and will not be able to get concentrator back. CM did ask if the [place] office could work together with the [employer] to collaborate with continued care for pt. Manager to call CM back with a plan of care.   04/02/14- 1620- Marvetta Gibbons RN, BSN [phone] Pt already active with [facility] for cont. 02 at home. Pt will need 5L of cont. 02 at discharge- new orders faxed to [place] for discharge in am.

## 2014-03-30 NOTE — Progress Notes (Signed)
PULMONARY / CRITICAL CARE MEDICINE   Name: Brent Powers MRN: [id number] DOB: 01-12-1946    ADMISSION DATE:  03/28/2014 CONSULTATION DATE:  03/28/2014  REFERRING MD :  [facility]-EDP  CHIEF COMPLAINT:  Dyspnea  INITIAL PRESENTATION: 68 yo male with COPD on nocturnal O2 presented to ED 9/16 with gradual onset SOB and productive cough. In ED was profoundly hypoxic and acidemic. He was placed on BiPAP with little improvement in ABG. He was transferred to [facility] for ICU admission and PCCM eval.   STUDIES:  9/17 CTA chest > no PE, severe emphysema, no cavitation suggestive of TB, extensive LUL infiltrate  SIGNIFICANT EVENTS: 9/17 admitted to ICU on BiPAP 9/18: ABG not improved  SUBJECTIVE:  bipap taken off and patient tolerating 5LNC; reports feeling comfortable with no SOB  VITAL SIGNS: Temp:  [97.6 F (36.4 C)-98.5 F (36.9 C)] 97.8 F (36.6 C) (09/18 0900) Pulse Rate:  [47-104] 92 (09/18 1000) Resp:  [13-28] 26 (09/18 1000) BP: (89-135)/(52-81) 117/62 mmHg (09/18 1000) SpO2:  [91 %-99 %] 97 % (09/18 1000) FiO2 (%):  [40 %] 40 % (09/18 0837) Weight:  [92.5 kg (203 lb 14.8 oz)] 92.5 kg (203 lb 14.8 oz) (09/18 0500) HEMODYNAMICS:   VENTILATOR SETTINGS: Vent Mode:  [-]  FiO2 (%):  [40 %] 40 % INTAKE / OUTPUT:  Intake/Output Summary (Last 24 hours) at 03/30/14 1023 Last data filed at 03/30/14 1000  Gross per 24 hour  Intake   2350 ml  Output   1850 ml  Net    500 ml    PHYSICAL EXAMINATION: General:  NAD Neuro:  Alert, oriented HEENT:  [place]/AT, PERRL, no JVD noted Cardiovascular:  S1 s 2 Lungs:  Diminished in LUL Abdomen:  Soft, non-tender, non-distended Musculoskeletal:  No acute deformity, no edema. +2 peripheral pulses.  Skin:  Intact, MMM  LABS:  CBC  Recent Labs Lab 03/29/14 0001 03/29/14 0743 03/30/14 0208  WBC 17.2* 23.2* 19.4*  HGB 12.7* 11.4* 10.8*  HCT 36.6* 33.1* 31.7*  PLT 193 175 192   Coag's  Recent Labs Lab 03/29/14 0743  INR 1.22    BMET  Recent Labs Lab 03/29/14 0001 03/29/14 0743 03/30/14 0208  NA 134* 135* 140  K 4.3 4.4 4.4  CL 92* 97 102  CO2 24 19 27   BUN 36* 24* 21  CREATININE 1.50* 0.87 0.70  GLUCOSE 194* 200* 179*   Electrolytes  Recent Labs Lab 03/29/14 0001 03/29/14 0743 03/30/14 0208  CALCIUM 10.0 8.8 8.9  MG  --  1.9  --   PHOS  --  3.0  --    Sepsis Markers  Recent Labs Lab 03/29/14 0141 03/29/14 0743  LATICACIDVEN 0.88 0.9  PROCALCITON  --  5.43   ABG  Recent Labs Lab 03/29/14 0241 03/29/14 0633 03/30/14 0406  PHART 7.229* 7.276* 7.268*  PCO2ART 57.6* 44.3 59.4*  PO2ART 168.0* 82.0 98.2   Liver Enzymes  Recent Labs Lab 03/29/14 0743  AST 34  ALT 39  ALKPHOS 87  BILITOT 0.7  ALBUMIN 2.6*   Cardiac Enzymes  Recent Labs Lab 03/29/14 0001  TROPONINI <0.30  PROBNP 1730.0*   Glucose  Recent Labs Lab 03/29/14 1154 03/29/14 1605 03/29/14 1948 03/29/14 2359 03/30/14 0420 03/30/14 0825  GLUCAP 189* 187* 168* 190* 143* 148*    Imaging Ct Angio Chest Pe W/cm &/or Wo Cm  03/29/2014   CLINICAL DATA:  Evaluate for PE, parenchymal disease.  EXAM: CT ANGIOGRAPHY CHEST WITH CONTRAST  TECHNIQUE: Multidetector CT imaging  of the chest was performed using the standard protocol during bolus administration of intravenous contrast. Multiplanar CT image reconstructions and MIPs were obtained to evaluate the vascular anatomy.  CONTRAST:  73mL OMNIPAQUE IOHEXOL 350 MG/ML SOLN  COMPARISON:  Prior radiograph from earlier the same day.  FINDINGS: Thyroid gland within normal limits.  Enlarged 1.5 cm AE recess node present (series 4, image 39). Precarinal node measures 1 cm (series 4, image 43). No hilar or axillary adenopathy.  Intrathoracic aorta of normal caliber. Great vessels within normal limits.  Heart size normal.  No pericardial effusion.  Pulmonary arterial tree is well opacified. No filling defect to suggest acute pulmonary embolism identified. Re-formatted imaging  confirms these findings. Please note that evaluation for possible small distal emboli somewhat limited due to respiratory motion artifact on this exam.  Severe upper lobe predominant emphysema present. There is superimposed extensive parenchymal infiltrate throughout the left upper lobe, greatest within the left lung apex. Findings are most consistent with pneumonia. No cavitation. The left lower lobe and right lung are clear. No pleural effusion. No pneumothorax. No worrisome pulmonary nodule or mass.  Visualized upper abdomen is within normal limits.  No acute osseous abnormality. No worrisome lytic or blastic osseous lesions. Gynecomastia noted.  IMPRESSION: 1. No CT evidence of acute pulmonary embolism. Please note that evaluation of the distal segmental pulmonary arteries is fairly limited on this exam due to extensive respiratory motion artifact. 2. Severe emphysema with superimposed left upper lobe pneumonia. No cavitary changes to suggest post - primary tuberculous infection, although this is not entirely excluded. 3. Mediastinal adenopathy is above, likely reactive in nature. Results were discussed by telephone at the time of interpretation on 03/29/2014 at 3:35 am to Dr. Varney Biles , who verbally acknowledged these results.   Electronically Signed   By: Jeannine Boga M.D.   On: 03/29/2014 03:34   Dg Chest Portable 1 View  03/29/2014   CLINICAL DATA:  Shortness of breath. History of emphysema, diabetes. History of smoking.  EXAM: PORTABLE CHEST - 1 VIEW  COMPARISON:  None.  FINDINGS: Heart size is within normal limits accounting for the portable position of the patient. There is diffuse interstitial infiltrate throughout the left lung. More focal opacity at the left apex is noted. Right lung is essentially clear. No pulmonary edema.  IMPRESSION: Interstitial infiltrate throughout the left lung with left apical confluence. Tuberculosis needs to be excluded. Consider CT the chest with contrast  for further evaluation.  The findings were discussed with ANKIT NANAVATI on 03/29/2014 at 1:30 am.   Electronically Signed   By: Shon Hale M.D.   On: 03/29/2014 01:31     ASSESSMENT / PLAN:  PULMONARY A: Acute on chronic hypercarbic respiratory failure Severe COPD with evidence of acute exacerbation Necrotizing PNA (NOS) Gas exchange worse when comparing serial ABGs, however, clinically he reports feeling better, no distress on 5LNC  P:   SpO2 goal 90-95% Change biPap to PRN Ph does not reflect his excellent clinical status Scheduled Nebs (duoneb, budesonide) Prednisone, limit in setting infection as able Continue current prednisone with plan to taper Mobilize and add flutter Any clinical declines, re initiate scheduled NIMV  CARDIOVASCULAR A:   Tachycardia (sinus)- resolved P:  Continue cardiac monitoring.   RENAL A:   AKI, improved  P:   Euvolemia goal Follow Bmet  GASTROINTESTINAL A:   No acute issues  P:   advance diet SUP: change to oral PPI   HEMATOLOGIC A:   Leukocytosis  Anemia  DVT prevention  P:  Trend CBC Heparin for VTE ppx  INFECTIOUS A:   CAP - atypical distribution Concern is necrotizing SIRS/sepsis Leg neg  P:   BCx2 9/17>>> Sputum 9/17 >>> Urine 9/17 >>> Abx: levaquin 9/17, start date 9/17, day 2/7 Abx: pip/tazo, start date 9/17, day 2/x Abx: vanc start date 9/17. Day 2 If drops BP or declines, add clinda for toxin concerns pcxr in am   Will narrow in am and keep  [place] zosyn if remains negative Consider CT chest follow up as outpt  ENDOCRINE A:   DMII    P:   SSI  NEUROLOGIC A:   Awake and alert; no active issues  P:   Monitor for hypercarbic encephalopathy PT/OT consult   Summary: improving with current Atb, advance activity, add diet and change biPaP to PRN, if he tolerates escalated activity, may go to step-down  [employer]. Titus Mould, MD, [place] Pgr: [place] [facility]

## 2014-03-31 LAB — GLUCOSE, CAPILLARY
GLUCOSE-CAPILLARY: 197 mg/dL — AB (ref 70–99)
GLUCOSE-CAPILLARY: 264 mg/dL — AB (ref 70–99)
GLUCOSE-CAPILLARY: 218 mg/dL — AB (ref 70–99)
Glucose-Capillary: 108 mg/dL — ABNORMAL HIGH (ref 70–99)
Glucose-Capillary: 182 mg/dL — ABNORMAL HIGH (ref 70–99)
Glucose-Capillary: 159 mg/dL — ABNORMAL HIGH (ref 70–99)

## 2014-03-31 MED ORDER — PANTOPRAZOLE SODIUM 40 MG PO TBEC
40.0000 mg | DELAYED_RELEASE_TABLET | Freq: Every day | ORAL | Status: DC
Start: 2014-03-31 — End: 2014-04-04
  Administered 2014-03-31 – 2014-04-04 (×5): 40 mg via ORAL
  Filled 2014-03-31 (×5): qty 1

## 2014-03-31 NOTE — Progress Notes (Signed)
PULMONARY / CRITICAL CARE MEDICINE   Name: Brent Powers MRN: [id number] DOB: 1946/05/12    ADMISSION DATE:  03/28/2014 CONSULTATION DATE:  03/28/2014  REFERRING MD :  [facility]-EDP  CHIEF COMPLAINT:  Dyspnea  INITIAL PRESENTATION: 68 yo male with COPD on nocturnal O2 adm with severe LUL CAP  presented to ED 9/16 with gradual onset SOB and productive cough. In ED was profoundly hypoxic and acidemic. He was placed on BiPAP with little improvement in ABG. He was transferred to [facility] for ICU admission and PCCM eval.   STUDIES:  9/17 CTA chest > no PE, severe emphysema, no cavitation suggestive of TB, extensive LUL infiltrate  SIGNIFICANT EVENTS: 9/17 admitted to ICU on BiPAP   SUBJECTIVE: tolerated bipap Overnight but did not like it at all tolerating 5LNC; reports feeling comfortable with no SOB oob to chair  VITAL SIGNS: Temp:  [97.9 F (36.6 C)-98.8 F (37.1 C)] 97.9 F (36.6 C) (09/19 0801) Pulse Rate:  [80-108] 91 (09/19 0800) Resp:  [14-34] 20 (09/19 0800) BP: (110-137)/(65-88) 132/87 mmHg (09/19 0800) SpO2:  [90 %-99 %] 96 % (09/19 0806) FiO2 (%):  [40 %] 40 % (09/19 0600) Weight:  [93.2 kg (205 lb 7.5 oz)] 93.2 kg (205 lb 7.5 oz) (09/19 0500) HEMODYNAMICS:   VENTILATOR SETTINGS: Vent Mode:  [-]  FiO2 (%):  [40 %] 40 % INTAKE / OUTPUT:  Intake/Output Summary (Last 24 hours) at 03/31/14 1049 Last data filed at 03/31/14 0700  Gross per 24 hour  Intake 2627.5 ml  Output   1050 ml  Net 1577.5 ml    PHYSICAL EXAMINATION: General:  NAD Neuro:  Alert, oriented HEENT:  [place]/AT, PERRL, no JVD noted Cardiovascular:  S1 s 2 Lungs:  DiminishedBL, no rales Abdomen:  Soft, non-tender, non-distended Musculoskeletal:  No acute deformity, no edema. +2 peripheral pulses.  Skin:  Intact, MMM  LABS:  CBC  Recent Labs Lab 03/29/14 0001 03/29/14 0743 03/30/14 0208  WBC 17.2* 23.2* 19.4*  HGB 12.7* 11.4* 10.8*  HCT 36.6* 33.1* 31.7*  PLT 193 175 192   Coag's  Recent  Labs Lab 03/29/14 0743  INR 1.22   BMET  Recent Labs Lab 03/29/14 0001 03/29/14 0743 03/30/14 0208  NA 134* 135* 140  K 4.3 4.4 4.4  CL 92* 97 102  CO2 24 19 27   BUN 36* 24* 21  CREATININE 1.50* 0.87 0.70  GLUCOSE 194* 200* 179*   Electrolytes  Recent Labs Lab 03/29/14 0001 03/29/14 0743 03/30/14 0208  CALCIUM 10.0 8.8 8.9  MG  --  1.9  --   PHOS  --  3.0  --    Sepsis Markers  Recent Labs Lab 03/29/14 0141 03/29/14 0743  LATICACIDVEN 0.88 0.9  PROCALCITON  --  5.43   ABG  Recent Labs Lab 03/29/14 0241 03/29/14 0633 03/30/14 0406  PHART 7.229* 7.276* 7.268*  PCO2ART 57.6* 44.3 59.4*  PO2ART 168.0* 82.0 98.2   Liver Enzymes  Recent Labs Lab 03/29/14 0743  AST 34  ALT 39  ALKPHOS 87  BILITOT 0.7  ALBUMIN 2.6*   Cardiac Enzymes  Recent Labs Lab 03/29/14 0001  TROPONINI <0.30  PROBNP 1730.0*   Glucose  Recent Labs Lab 03/30/14 1225 03/30/14 1650 03/30/14 1958 03/30/14 2354 03/31/14 0412 03/31/14 0730  GLUCAP 138* 164* 247* 182* 108* 159*    Imaging No results found.   ASSESSMENT / PLAN:  PULMONARY A: Acute on chronic hypercarbic respiratory failure Severe COPD with evidence of acute exacerbation Necrotizing LUL PNA (  NOS)  P:   SpO2 goal 90% Change biPap to PRN Scheduled Nebs (duoneb, budesonide) Continue current prednisone with plan to taper Mobilize and add flutter  CARDIOVASCULAR A:   Tachycardia (sinus)- resolved P:  Continue cardiac monitoring.   RENAL A:   AKI -resolved  P:   Follow Bmet  GASTROINTESTINAL A:   No acute issues  P:   advance diet SUP: change to oral PPI   HEMATOLOGIC A:   Leukocytosis Anemia  DVT prevention  P:  Trend CBC Heparin for VTE ppx  INFECTIOUS A:   CAP - Concern is necrotizing SIRS/sepsis Leg neg  P:   BCx2 9/17>>>ng Sputum 9/17 >>>ng Urine 9/17 >>>ng Abx: levaquin 9/17, start date 9/17, day 2/7 Abx: pip/tazo, start date 9/17,  Abx: vanc start  date 9/17>> 9/19  Dc vanc  ENDOCRINE A:   DMII    P:   SSI  NEUROLOGIC A:   Awake and alert; no active issues  P:   Monitor for hypercarbic encephalopathy PT/OT consult   Summary: improving with current Atb, advance activity, add diet and change biPaP to PRN, if he tolerates escalated activity, may go to step-down  Kara Mead MD. Shade Flood. [place] [facility] Pager [phone] If no response call [phone]

## 2014-04-01 ENCOUNTER — Inpatient Hospital Stay (HOSPITAL_COMMUNITY): Payer: Medicare Other

## 2014-04-01 LAB — GLUCOSE, CAPILLARY
GLUCOSE-CAPILLARY: 114 mg/dL — AB (ref 70–99)
GLUCOSE-CAPILLARY: 191 mg/dL — AB (ref 70–99)
GLUCOSE-CAPILLARY: 261 mg/dL — AB (ref 70–99)
Glucose-Capillary: 144 mg/dL — ABNORMAL HIGH (ref 70–99)
Glucose-Capillary: 117 mg/dL — ABNORMAL HIGH (ref 70–99)
Glucose-Capillary: 206 mg/dL — ABNORMAL HIGH (ref 70–99)
Glucose-Capillary: 227 mg/dL — ABNORMAL HIGH (ref 70–99)
Glucose-Capillary: 171 mg/dL — ABNORMAL HIGH (ref 70–99)

## 2014-04-01 LAB — CBC
HCT: 32.9 % — ABNORMAL LOW (ref 39.0–52.0)
HEMOGLOBIN: 11.1 g/dL — AB (ref 13.0–17.0)
MCH: 30.4 pg (ref 26.0–34.0)
MCHC: 33.7 g/dL (ref 30.0–36.0)
MCV: 90.1 fL (ref 78.0–100.0)
PLATELETS: 248 10*3/uL (ref 150–400)
RBC: 3.65 MIL/uL — AB (ref 4.22–5.81)
RDW: 14.9 % (ref 11.5–15.5)
WBC: 15.8 10*3/uL — AB (ref 4.0–10.5)

## 2014-04-01 LAB — BASIC METABOLIC PANEL
Anion gap: 11 (ref 5–15)
BUN: 17 mg/dL (ref 6–23)
CALCIUM: 9.2 mg/dL (ref 8.4–10.5)
CO2: 30 mEq/L (ref 19–32)
Chloride: 99 mEq/L (ref 96–112)
Creatinine, Ser: 0.6 mg/dL (ref 0.50–1.35)
GFR calc Af Amer: >90 mL/min (ref >90)
GFR calc non Af Amer: >90 mL/min (ref >90)
GLUCOSE: 133 mg/dL — AB (ref 70–99)
POTASSIUM: 4.2 meq/L (ref 3.7–5.3)
Sodium: 140 mEq/L (ref 137–147)

## 2014-04-01 LAB — CULTURE, RESPIRATORY

## 2014-04-01 LAB — CULTURE, RESPIRATORY W GRAM STAIN

## 2014-04-01 MED ORDER — BUDESONIDE 0.25 MG/2ML IN SUSP
0.5000 mg | Freq: Two times a day (BID) | RESPIRATORY_TRACT | Status: DC
  Filled 2014-04-01 (×3): qty 4

## 2014-04-01 MED ORDER — IPRATROPIUM-ALBUTEROL 0.5-2.5 (3) MG/3ML IN SOLN
3.0000 mL | Freq: Three times a day (TID) | RESPIRATORY_TRACT | Status: DC
  Administered 2014-04-02 (×2): 3 mL via RESPIRATORY_TRACT
  Filled 2014-04-01 (×2): qty 3

## 2014-04-01 MED ORDER — BUDESONIDE 0.25 MG/2ML IN SUSP
0.5000 mg | Freq: Two times a day (BID) | RESPIRATORY_TRACT | Status: DC
  Administered 2014-04-01 – 2014-04-02 (×2): 0.5 mg via RESPIRATORY_TRACT
  Filled 2014-04-01 (×5): qty 4

## 2014-04-01 NOTE — Progress Notes (Signed)
PULMONARY / CRITICAL CARE MEDICINE   Name: Brent Powers MRN: [id number] DOB: 1946-07-10    ADMISSION DATE:  03/28/2014 CONSULTATION DATE:  03/28/2014  REFERRING MD :  [facility]-EDP  CHIEF COMPLAINT:  Dyspnea  INITIAL PRESENTATION: 68 yo male with COPD on nocturnal O2 adm with severe LUL CAP  presented to ED 9/16 with gradual onset SOB and productive cough. In ED was profoundly hypoxic and acidemic. He was placed on BiPAP with little improvement in ABG. He was transferred to [facility] for ICU admission and PCCM eval.   STUDIES:  9/17 CTA chest > no PE, severe emphysema, no cavitation suggestive of TB, extensive LUL infiltrate  SIGNIFICANT EVENTS: 9/17 admitted to ICU on BiPAP 9/18 bipap noct   SUBJECTIVE:  tolerating 5LNC; desatn on exertion oob to chair  VITAL SIGNS: Temp:  [98 F (36.7 C)-98.5 F (36.9 C)] 98 F (36.7 C) (09/20 0736) Pulse Rate:  [81-107] 107 (09/20 1000) Resp:  [16-24] 20 (09/20 1000) BP: (117-156)/(65-95) 135/83 mmHg (09/20 1000) SpO2:  [86 %-100 %] 93 % (09/20 0800) Weight:  [92.7 kg (204 lb 5.9 oz)] 92.7 kg (204 lb 5.9 oz) (09/20 0419) HEMODYNAMICS:   VENTILATOR SETTINGS:   INTAKE / OUTPUT:  Intake/Output Summary (Last 24 hours) at 04/01/14 1011 Last data filed at 04/01/14 1000  Gross per 24 hour  Intake    340 ml  Output    700 ml  Net   -360 ml    PHYSICAL EXAMINATION: General:  NAD Neuro:  Alert, oriented HEENT:  [place]/AT, PERRL, no JVD noted Cardiovascular:  S1 s 2 Lungs:  Diminished BL, no rales Abdomen:  Soft, non-tender, non-distended Musculoskeletal:  No acute deformity, no edema. +2 peripheral pulses.  Skin:  Intact, MMM  LABS:  CBC  Recent Labs Lab 03/29/14 0743 03/30/14 0208 04/01/14 0240  WBC 23.2* 19.4* 15.8*  HGB 11.4* 10.8* 11.1*  HCT 33.1* 31.7* 32.9*  PLT 175 192 248   Coag's  Recent Labs Lab 03/29/14 0743  INR 1.22   BMET  Recent Labs Lab 03/29/14 0743 03/30/14 0208 04/01/14 0240  NA 135* 140 140  K  4.4 4.4 4.2  CL 97 102 99  CO2 19 27 30   BUN 24* 21 17  CREATININE 0.87 0.70 0.60  GLUCOSE 200* 179* 133*   Electrolytes  Recent Labs Lab 03/29/14 0743 03/30/14 0208 04/01/14 0240  CALCIUM 8.8 8.9 9.2  MG 1.9  --   --   PHOS 3.0  --   --    Sepsis Markers  Recent Labs Lab 03/29/14 0141 03/29/14 0743  LATICACIDVEN 0.88 0.9  PROCALCITON  --  5.43   ABG  Recent Labs Lab 03/29/14 0241 03/29/14 0633 03/30/14 0406  PHART 7.229* 7.276* 7.268*  PCO2ART 57.6* 44.3 59.4*  PO2ART 168.0* 82.0 98.2   Liver Enzymes  Recent Labs Lab 03/29/14 0743  AST 34  ALT 39  ALKPHOS 87  BILITOT 0.7  ALBUMIN 2.6*   Cardiac Enzymes  Recent Labs Lab 03/29/14 0001  TROPONINI <0.30  PROBNP 1730.0*   Glucose  Recent Labs Lab 03/31/14 1210 03/31/14 1602 03/31/14 1949 04/01/14 0010 04/01/14 0417 04/01/14 0716  GLUCAP 197* 264* 218* 114* 144* 117*    Imaging No results found.   ASSESSMENT / PLAN:  PULMONARY A: Acute on chronic hypercarbic respiratory failure Severe COPD with evidence of acute exacerbation Necrotizing LUL PNA (NOS) -CXR 9/20 slight worse vs evolution H/o throat CA in dec 2014  P:   SpO2 goal 90% biPap  prn Scheduled Nebs (duoneb, budesonide) Continue current prednisone with plan to taper Mobilize and add flutter  CARDIOVASCULAR A:   Tachycardia (sinus)- resolved P:  Continue cardiac monitoring.   RENAL A:   AKI -resolved  P:   Follow Bmet  GASTROINTESTINAL A:   Swallow eval given h/o throat cancer  P:   advance diet SUP: change to oral PPI   HEMATOLOGIC A:   Leukocytosis Anemia  DVT prevention  P:  Trend CBC Heparin for VTE ppx  INFECTIOUS A:   CAP - Concern is necrotizing SIRS/sepsis Leg neg  P:   BCx2 9/17>>>ng Sputum 9/17 >>>ng Urine 9/17 >>>ng Abx: levaquin 9/17, start date 9/17, day 2/7 Abx: pip/tazo, start date 9/17,  Abx: vanc start date 9/17>> 9/19  Dc vanc, plan for zosyn x 5ds , then PO  ok  ENDOCRINE A:   DMII    P:   SSI  NEUROLOGIC A:   Awake and alert; no active issues  P:   Monitor for hypercarbic encephalopathy PT/OT consult   Summary: improving with current Atb, advance activity,  may go to tele but monitor satn on exertion Swallow eval given h/o throat CA  Kara Mead MD. FCCP. [place] [facility] Pager [phone] If no response call [phone]

## 2014-04-02 ENCOUNTER — Inpatient Hospital Stay (HOSPITAL_COMMUNITY): Payer: Medicare Other

## 2014-04-02 LAB — BASIC METABOLIC PANEL
Anion gap: 12 (ref 5–15)
BUN: 16 mg/dL (ref 6–23)
CALCIUM: 9.5 mg/dL (ref 8.4–10.5)
CO2: 32 meq/L (ref 19–32)
CREATININE: 0.55 mg/dL (ref 0.50–1.35)
Chloride: 94 mEq/L — ABNORMAL LOW (ref 96–112)
GFR calc Af Amer: >90 mL/min (ref >90)
GFR calc non Af Amer: >90 mL/min (ref >90)
GLUCOSE: 244 mg/dL — AB (ref 70–99)
Potassium: 4.6 mEq/L (ref 3.7–5.3)
Sodium: 138 mEq/L (ref 137–147)

## 2014-04-02 LAB — GLUCOSE, CAPILLARY
GLUCOSE-CAPILLARY: 111 mg/dL — AB (ref 70–99)
GLUCOSE-CAPILLARY: 120 mg/dL — AB (ref 70–99)
GLUCOSE-CAPILLARY: 134 mg/dL — AB (ref 70–99)
Glucose-Capillary: 266 mg/dL — ABNORMAL HIGH (ref 70–99)
Glucose-Capillary: 236 mg/dL — ABNORMAL HIGH (ref 70–99)
Glucose-Capillary: 202 mg/dL — ABNORMAL HIGH (ref 70–99)

## 2014-04-02 MED ORDER — INSULIN ASPART 100 UNIT/ML ~~LOC~~ SOLN
0.0000 [IU] | Freq: Three times a day (TID) | SUBCUTANEOUS | Status: DC
  Administered 2014-04-02: 8 [IU] via SUBCUTANEOUS
  Administered 2014-04-02: 5 [IU] via SUBCUTANEOUS
  Administered 2014-04-03 – 2014-04-04 (×3): 2 [IU] via SUBCUTANEOUS

## 2014-04-02 MED ORDER — DORZOLAMIDE HCL 2 % OP SOLN: 1.0000 [drp] | Freq: Every day | OPHTHALMIC | Status: DC

## 2014-04-02 MED ORDER — SALINE SPRAY 0.65 % NA SOLN
1.0000 | NASAL | Status: DC | PRN
  Administered 2014-04-02: 1 via NASAL
  Filled 2014-04-02: qty 44

## 2014-04-02 MED ORDER — AMOXICILLIN-POT CLAVULANATE 875-125 MG PO TABS
1.0000 | ORAL_TABLET | Freq: Two times a day (BID) | ORAL | Status: DC
  Administered 2014-04-02 – 2014-04-04 (×4): 1 via ORAL
  Filled 2014-04-02 (×6): qty 1

## 2014-04-02 MED ORDER — PREDNISONE 10 MG PO TABS
10.0000 mg | ORAL_TABLET | Freq: Every day | ORAL | Status: DC
  Administered 2014-04-03 – 2014-04-04 (×2): 10 mg via ORAL
  Filled 2014-04-02 (×3): qty 1

## 2014-04-02 MED ORDER — LISINOPRIL 10 MG PO TABS
10.0000 mg | ORAL_TABLET | Freq: Every day | ORAL | Status: DC
  Administered 2014-04-02 – 2014-04-04 (×3): 10 mg via ORAL
  Filled 2014-04-02 (×3): qty 1

## 2014-04-02 MED ORDER — MOMETASONE FURO-FORMOTEROL FUM 100-5 MCG/ACT IN AERO
2.0000 | INHALATION_SPRAY | Freq: Two times a day (BID) | RESPIRATORY_TRACT | Status: DC
  Administered 2014-04-02 – 2014-04-04 (×4): 2 via RESPIRATORY_TRACT
  Filled 2014-04-02: qty 8.8

## 2014-04-02 MED ORDER — IPRATROPIUM-ALBUTEROL 0.5-2.5 (3) MG/3ML IN SOLN: 3.0000 mL | RESPIRATORY_TRACT | Status: DC | PRN

## 2014-04-02 MED ORDER — METFORMIN HCL 500 MG PO TABS
1000.0000 mg | ORAL_TABLET | Freq: Two times a day (BID) | ORAL | Status: DC
  Administered 2014-04-02 – 2014-04-04 (×4): 1000 mg via ORAL
  Filled 2014-04-02 (×6): qty 2

## 2014-04-02 MED ORDER — TIOTROPIUM BROMIDE MONOHYDRATE 18 MCG IN CAPS: 18.0000 ug | ORAL_CAPSULE | Freq: Every day | RESPIRATORY_TRACT | Status: DC

## 2014-04-02 NOTE — Progress Notes (Signed)
PULMONARY / CRITICAL CARE MEDICINE   Name: Brent Powers MRN: [id number] DOB: 07-Oct-1945    ADMISSION DATE:  03/28/2014  REFERRING MD :  [facility]P-EDP  CHIEF COMPLAINT:  Dyspnea  INITIAL PRESENTATION:  68 yo male presented with progressive dyspnea, productive cough, hypoxia from LUL PNA.  He has hx of COPD on home oxygen followed by pulmonary in [place].  STUDIES:  9/17 CTA chest >> LUL ASD, upper lobe predominant emphysema  SIGNIFICANT EVENTS: 9/17  admitted to ICU on BiPAP 9/18  bipap noct 9/20  Transferred to tele  SUBJECTIVE:  Breathing better.  Not as much cough.  Still needing supplemental oxygen during the day.  Anxious to go home to [place].  VITAL SIGNS: Temp:  [97.5 F (36.4 C)-99.1 F (37.3 C)] 98.7 F (37.1 C) (09/21 0500) Pulse Rate:  [78-100] 99 (09/21 0903) Resp:  [18-20] 20 (09/21 0903) BP: (129-157)/(72-90) 129/72 mmHg (09/21 0500) SpO2:  [89 %-97 %] 89 % (09/21 0903) Weight:  [200 lb 11.2 oz (91.037 kg)] 200 lb 11.2 oz (91.037 kg) (09/21 0500) INTAKE / OUTPUT:  Intake/Output Summary (Last 24 hours) at 04/02/14 1010 Last data filed at 04/02/14 0500  Gross per 24 hour  Intake    360 ml  Output    300 ml  Net     60 ml    PHYSICAL EXAMINATION: General:  WDWN male sitting in chair, NAD Neuro:  Alert, oriented HEENT:  [place]/AT, no JVD noted Cardiovascular:  S1 s 2 Lungs:  Diminished BL, no rales Abdomen:  Soft, non-tender, non-distended Musculoskeletal:  No acute deformity, no edema. +2 peripheral pulses.  Skin:  Intact  LABS:  CBC  Recent Labs Lab 03/29/14 0743 03/30/14 0208 04/01/14 0240  WBC 23.2* 19.4* 15.8*  HGB 11.4* 10.8* 11.1*  HCT 33.1* 31.7* 32.9*  PLT 175 192 248   Coag's  Recent Labs Lab 03/29/14 0743  INR 1.22   BMET  Recent Labs Lab 03/29/14 0743 03/30/14 0208 04/01/14 0240  NA 135* 140 140  K 4.4 4.4 4.2  CL 97 102 99  CO2 19 27 30   BUN 24* 21 17  CREATININE 0.87 0.70 0.60  GLUCOSE 200* 179* 133*    Electrolytes  Recent Labs Lab 03/29/14 0743 03/30/14 0208 04/01/14 0240  CALCIUM 8.8 8.9 9.2  MG 1.9  --   --   PHOS 3.0  --   --    Sepsis Markers  Recent Labs Lab 03/29/14 0141 03/29/14 0743  LATICACIDVEN 0.88 0.9  PROCALCITON  --  5.43   ABG  Recent Labs Lab 03/29/14 0241 03/29/14 0633 03/30/14 0406  PHART 7.229* 7.276* 7.268*  PCO2ART 57.6* 44.3 59.4*  PO2ART 168.0* 82.0 98.2   Liver Enzymes  Recent Labs Lab 03/29/14 0743  AST 34  ALT 39  ALKPHOS 87  BILITOT 0.7  ALBUMIN 2.6*   Cardiac Enzymes  Recent Labs Lab 03/29/14 0001  TROPONINI <0.30  PROBNP 1730.0*   Glucose  Recent Labs Lab 04/01/14 1705 04/01/14 1809 04/01/14 2122 04/02/14 0029 04/02/14 0324 04/02/14 0757  GLUCAP 261* 227* 171* 120* 111* 134*    Imaging Dg Chest Port 1 View  04/02/2014   CLINICAL DATA:  Pneumonitis, feeling better  EXAM: PORTABLE CHEST - 1 VIEW  COMPARISON:  Portable exam 6144 hr compared to 04/01/2014  FINDINGS: Normal heart size, mediastinal contours and pulmonary vascularity.  Diffuse LEFT lung infiltrate consistent with pneumonia, greater in LEFT upper lobe, slightly improved.  Remaining lungs clear.  Underlying emphysematous changes.  No gross pleural effusion or pneumothorax.  IMPRESSION: COPD changes with slightly improved LEFT lung infiltrate consistent with pneumonia.   Electronically Signed   By: Lavonia Dana M.D.   On: 04/02/2014 08:08   Dg Chest Port 1 View  04/01/2014   CLINICAL DATA:  Followup pneumonia superimposed upon COPD.  EXAM: PORTABLE CHEST - 1 VIEW  COMPARISON:  Portable chest x-ray and CTA chest 03/29/2014.  FINDINGS: Interval worsening of airspace consolidation in the left upper lobe and the left lower lobe. Emphysematous changes in both lungs. Right lung remains clear. Cardiac silhouette normal in size, unchanged. Pulmonary vascularity normal.  IMPRESSION: Worsening pneumonia in the left upper lobe and left lower lobe, superimposed upon  COPD/emphysema.   Electronically Signed   By: Evangeline Dakin M.D.   On: 04/01/2014 08:19      ASSESSMENT / PLAN:  Acute on chronic hypoxic/hypercapnic respiratory failure 2nd to PNA/sepsis and AECOPD. Severe COPD with emphysema. Plan:   Resume advair (dulera while in hospital), spiriva 9/21 PRN albuterol Adjust oxygen to keep SpO2 > 90% Wean off prednisone as tolerated over next week Bronchial hygiene Will need f/u CXR as outpt Day 6 of Abx >> change to po augmentin 9/21  Sinus tachycardia related to respiratory distress. Hx of HTN. Plan:  Resume lisinopril 9/21  Hx of throat cancer with concern for aspiration as cause of PNA. Plan: Seen by speech 9/21 >> regular diet with thin liquids  Leukocytosis 2nd to PNA, steroids. 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 Plan: SSI Resume glucophage 9/21  AKI 2nd to hypovolemia - resolved.  Updated pt's wife.  Clinically improving.  Will change to po ABx.  Will need to arrange for home oxygen for transport back to [place].  If stable, then possible d/c home 9/22.  Chesley Mires, MD [facility] 04/02/2014, 3:31 PM Pager:  [phone] After 3pm call: [phone]

## 2014-04-02 NOTE — Progress Notes (Signed)
Inpatient Diabetes Program Recommendations  AACE/ADA: New Consensus Statement on Inpatient Glycemic Control (2013)  Target Ranges:  Prepandial:   less than 140 mg/dL      Peak postprandial:   less than 180 mg/dL (1-2 hours)      Critically ill patients:  140 - 180 mg/dL     Results for MYKELL, RAWL (MRN [id number]) as of 04/02/2014 07:53  Ref. Range 04/01/2014 00:10 04/01/2014 04:17 04/01/2014 07:16 04/01/2014 11:33 04/01/2014 12:20 04/01/2014 17:05 04/01/2014 18:09 04/01/2014 21:22  Glucose-Capillary Latest Range: 70-99 mg/dL 114 (H) 144 (H) 117 (H) 191 (H) 206 (H) 261 (H) 227 (H) 171 (H)     Patient eating full liquid diet.   MD- Please consider the following:  1. D/C current Novolog SSI orders 2. Start Novolog Moderate SSI tid ac + HS (per Glycemic Control Order set) 3. Start Novolog 3 units tid with meals (Meal Coverage)   Will follow Wyn Quaker RN, MSN, CDE Diabetes Coordinator Inpatient Diabetes Program Team Pager: [phone] (8a-10p)

## 2014-04-02 NOTE — Progress Notes (Signed)
Spoke with patient and his wife.  Wife is not comfortable driving his RV home for four hours.  Patient states he feels fine to drive RV four hours home with home oxygen and positive productive cough.  Will continue to monitor.  Sanda Linger

## 2014-04-02 NOTE — Evaluation (Signed)
Clinical/Bedside Swallow Evaluation Patient Details  Name: Brent Powers MRN: [id number] Date of Birth: [birth year]  Today's Date: 04/02/2014 Time: 1111-1130 SLP Time Calculation (min): 19 min  Past Medical History:  Past Medical History  Diagnosis Date  . Emphysema lung   . Diabetes type 2, controlled   . Hypertension   . Throat cancer 06/2014   Past Surgical History: History reviewed. No pertinent past surgical history. HPI:  68 yo male with COPD on nocturnal O2 adm with severe LUL CAP presented to ED 9/16 with gradual onset SOB and productive cough. In ED was profoundly hypoxic and acidemic. He was placed on BiPAP with little improvement in ABG. He was transferred to [facility] for ICU admission and PCCM eval. Additional PMH of throat cancer in December 2014.    Assessment / Plan / Recommendation Clinical Impression  Patient presents with a functional oropharyngeal swallow without overt indication of aspiration.Timing and strength of swallow appear WFL. Risk for aspiration remains given h/o throat cancer s/p radiation and severity of COPD however at this time, patient remains safe for a regular diet, thin liquids with general aspiration precautions. Educated patient on findings and precautions. No SLP f/u indicated at this time.     Aspiration Risk  Mild    Diet Recommendation Regular;Thin liquid   Liquid Administration via: Cup;Straw Medication Administration: Whole meds with liquid Supervision: Patient able to self feed Compensations: Slow rate;Small sips/bites Postural Changes and/or Swallow Maneuvers: Seated upright 90 degrees    Other  Recommendations Oral Care Recommendations: Oral care BID   Follow Up Recommendations  None    Frequency and Duration        Pertinent Vitals/Pain n/a     Swallow Study Prior Functional Status  Type of Home: House Available Help at Discharge: Available PRN/intermittently;Family    General HPI: 68 yo male with COPD on nocturnal O2 adm  with severe LUL CAP presented to ED 9/16 with gradual onset SOB and productive cough. In ED was profoundly hypoxic and acidemic. He was placed on BiPAP with little improvement in ABG. He was transferred to [facility] for ICU admission and PCCM eval. Additional PMH of throat cancer in December 2014.  Type of Study: Bedside swallow evaluation Previous Swallow Assessment: per patient, MBS completed following radiation treatment with no significant findings Diet Prior to this Study: Regular;Thin liquids Temperature Spikes Noted: No Respiratory Status: Nasal cannula History of Recent Intubation: No Behavior/Cognition: Alert;Cooperative;Pleasant mood Oral Cavity - Dentition: Dentures, top;Dentures, bottom Self-Feeding Abilities: Able to feed self Patient Positioning: Upright in chair Baseline Vocal Quality: Clear Volitional Cough: Strong Volitional Swallow: Able to elicit    Oral/Motor/Sensory Function Overall Oral Motor/Sensory Function: Appears within functional limits for tasks assessed   Ice Chips Ice chips: Not tested   Thin Liquid Thin Liquid: Within functional limits Presentation: Cup;Self Fed;Straw    Nectar Thick Nectar Thick Liquid: Not tested   Honey Thick Honey Thick Liquid: Not tested   Puree Puree: Within functional limits Presentation: Spoon;Self Fed   Solid   GO   Brent Greulich MA, CCC-SLP [phone]  Solid: Within functional limits Presentation: Brent Powers 04/02/2014,1:12 PM

## 2014-04-02 NOTE — Progress Notes (Addendum)
SATURATION QUALIFICATIONS: (This note is used to comply with regulatory documentation for home oxygen)  Patient Saturations on Room Air at Rest = 70% room air and  88-91% on 5 liters  Patient Saturations on Room Air while Ambulating = %  Patient Saturations on 4 Liters of oxygen while Ambulating = 75%  Oxygen saturations back up to 91% back at rest with O2 5 liters after ambulation  Please briefly explain why patient needs home oxygen: Brent Powers

## 2014-04-02 NOTE — Evaluation (Signed)
Physical Therapy Evaluation Patient Details Name: Brent Powers MRN: [id number] DOB: January 13, 1946 Today's Date: 04/02/2014   History of Present Illness  68 yo male with COPD on nocturnal O2 presented to ED 9/16 with gradual onset SOB and productive cough. In ED was profoundly hypoxic and acidemic. He was placed on BiPAP with little improvement in ABG. He was transferred to [facility] for ICU admission. Pt with PMH of ephysema, COPD, DM, HTN, throat cancer. Pt with necrotizing PNA.  Clinical Impression  Pt admitted with hypoxia and poor activity tolerance. Pt currently with functional limitations due to the deficits listed below (see PT Problem List). Pt ambulated 200' on 4L O2 with desaturation to 75%, returned to 91% within 5 mins of rest on 5L O2. Pt will benefit from skilled PT to increase their independence and safety with mobility to allow discharge to the venue listed below. PT will continue to follow acutely.       Follow Up Recommendations No PT follow up    Equipment Recommendations  Other (comment) (TBD)    Recommendations for Other Services       Precautions / Restrictions Restrictions Weight Bearing Restrictions: No Other Position/Activity Restrictions: watch O2 sats      Mobility  Bed Mobility               General bed mobility comments: pt up in chair, reported slept in chair last night because bed not comfortable  Transfers Overall transfer level: Independent Equipment used: None                Ambulation/Gait Ambulation/Gait assistance: Supervision Ambulation Distance (Feet): 200 Feet Assistive device: Rolling walker (2 wheeled) Gait Pattern/deviations: Step-through pattern;Decreased stride length Gait velocity: decreased due to SOB Gait velocity interpretation: Below normal speed for age/gender General Gait Details: used RW for energy conservation, pt felt good with ambulation, maintain decreased pace for energy conservation but O2 sats still  decreased to 75% on 4L, pt able to verbalize despite desaturation. Returned to 92% after 5 mins rest on 5L O2.   Stairs            Wheelchair Mobility    Modified Rankin (Stroke Patients Only)       Balance Overall balance assessment: No apparent balance deficits (not formally assessed)                                           Pertinent Vitals/Pain Pain Assessment: No/denies pain See gait assessment for O2 sats    Home Living Family/patient expects to be discharged to:: Private residence Living Arrangements: Spouse/significant other Available Help at Discharge: Available PRN/intermittently;Family Type of Home: House Home Access: Stairs to enter Entrance Stairs-Rails: Right Entrance Stairs-Number of Steps: 10 Home Layout: Multi-level Home Equipment: None Additional Comments: pt's wife works. Pt spends a lot of time camping in RV (14') was very active until last week when he got sick.    Prior Function Level of Independence: Independent         Comments: drives, retired, works in yard     Journalist, newspaper        Extremity/Trunk Assessment   Upper Extremity Assessment: Overall WFL for tasks assessed           Lower Extremity Assessment: Overall WFL for tasks assessed      Cervical / Trunk Assessment: Normal  Communication   Communication: No difficulties  Cognition Arousal/Alertness: Awake/alert Behavior During Therapy: WFL for tasks assessed/performed Overall Cognitive Status: Within Functional Limits for tasks assessed                      General Comments      Exercises General Exercises - Lower Extremity Ankle Circles/Pumps: AROM;Both;10 reps;Seated Long Arc Quad: AROM;Both;10 reps;Seated Hip Flexion/Marching: AROM;Both;10 reps;Seated      Assessment/Plan    PT Assessment Patient needs continued PT services  PT Diagnosis Difficulty walking   PT Problem List Decreased activity tolerance;Decreased  mobility;Cardiopulmonary status limiting activity  PT Treatment Interventions DME instruction;Gait training;Stair training;Functional mobility training;Therapeutic activities;Therapeutic exercise;Patient/family education   PT Goals (Current goals can be found in the Care Plan section) Acute Rehab PT Goals Patient Stated Goal: get better PT Goal Formulation: With patient Time For Goal Achievement: 04/16/14 Potential to Achieve Goals: Good Additional Goals Additional Goal #1: Pt to ambulate with out without supplemental O2 with O2 sats >88%    Frequency Min 3X/week   Barriers to discharge        Co-evaluation               End of Session Equipment Utilized During Treatment: Gait belt;Oxygen Activity Tolerance: Patient limited by fatigue Patient left: in chair;with chair alarm set;with call bell/phone within reach Nurse Communication: Mobility status         Time: 1033-1100 PT Time Calculation (min): 27 min   Charges:   PT Evaluation $Initial PT Evaluation Tier I: 1 Procedure PT Treatments $Gait Training: 23-37 mins   PT G Codes:        Leighton Roach, PT  Acute Rehab Services  [place], [employer] 04/02/2014, 12:27 PM

## 2014-04-03 ENCOUNTER — Inpatient Hospital Stay (HOSPITAL_COMMUNITY): Payer: Medicare Other

## 2014-04-03 LAB — BASIC METABOLIC PANEL
Anion gap: 8 (ref 5–15)
BUN: 15 mg/dL (ref 6–23)
CALCIUM: 9.3 mg/dL (ref 8.4–10.5)
CO2: 36 mEq/L — ABNORMAL HIGH (ref 19–32)
CREATININE: 0.61 mg/dL (ref 0.50–1.35)
Chloride: 95 mEq/L — ABNORMAL LOW (ref 96–112)
GFR calc Af Amer: >90 mL/min (ref >90)
GLUCOSE: 156 mg/dL — AB (ref 70–99)
Potassium: 3.9 mEq/L (ref 3.7–5.3)
Sodium: 139 mEq/L (ref 137–147)

## 2014-04-03 LAB — GLUCOSE, CAPILLARY
GLUCOSE-CAPILLARY: 149 mg/dL — AB (ref 70–99)
Glucose-Capillary: 142 mg/dL — ABNORMAL HIGH (ref 70–99)
Glucose-Capillary: 144 mg/dL — ABNORMAL HIGH (ref 70–99)
Glucose-Capillary: 161 mg/dL — ABNORMAL HIGH (ref 70–99)

## 2014-04-03 LAB — CBC
HCT: 34.1 % — ABNORMAL LOW (ref 39.0–52.0)
Hemoglobin: 11.7 g/dL — ABNORMAL LOW (ref 13.0–17.0)
MCH: 30 pg (ref 26.0–34.0)
MCHC: 34.3 g/dL (ref 30.0–36.0)
MCV: 87.4 fL (ref 78.0–100.0)
Platelets: 310 10*3/uL (ref 150–400)
RBC: 3.9 MIL/uL — ABNORMAL LOW (ref 4.22–5.81)
RDW: 14.6 % (ref 11.5–15.5)
WBC: 17 10*3/uL — ABNORMAL HIGH (ref 4.0–10.5)

## 2014-04-03 NOTE — Progress Notes (Signed)
PULMONARY / CRITICAL CARE MEDICINE   Name: Brent Powers MRN: [id number] DOB: 04-15-1946    ADMISSION DATE:  03/28/2014  REFERRING MD :  [facility]  CHIEF COMPLAINT:  Dyspnea  INITIAL PRESENTATION:  68 yo male presented with progressive dyspnea, productive cough, hypoxia from LUL PNA.  He has hx of COPD on home oxygen followed by pulmonary in [place].  STUDIES:  9/17 CTA chest >> LUL ASD, upper lobe predominant emphysema  SIGNIFICANT EVENTS: 9/17  admitted to ICU on BiPAP 9/18  bipap noct 9/20  Transferred to tele  SUBJECTIVE:  Breathing better.  Not as much cough.  Still needing supplemental oxygen during the day.  Anxious to go home to [place].  Feels comfortable driving RV 4 hours.  VITAL SIGNS: Temp:  [98.4 F (36.9 C)-98.7 F (37.1 C)] 98.4 F (36.9 C) (09/22 0500) Pulse Rate:  [89-105] 94 (09/22 0500) Resp:  [18-20] 20 (09/22 0500) BP: (120-132)/(65-79) 121/65 mmHg (09/22 0500) SpO2:  [75 %-96 %] 95 % (09/22 0500) Weight:  [199 lb 9.6 oz (90.538 kg)] 199 lb 9.6 oz (90.538 kg) (09/22 0500) INTAKE / OUTPUT:  Intake/Output Summary (Last 24 hours) at 04/03/14 0746 Last data filed at 04/02/14 1800  Gross per 24 hour  Intake    960 ml  Output    552 ml  Net    408 ml    PHYSICAL EXAMINATION: General:  WDWN male sitting in chair, NAD Neuro:  Alert, oriented HEENT:  [place]/AT, no JVD noted Cardiovascular:  S1 s 2 Lungs:  Diminished BL, no rales Abdomen:  Soft, non-tender, non-distended Musculoskeletal:  No acute deformity, no edema. +2 peripheral pulses.  Skin:  Intact  LABS:  CBC  Recent Labs Lab 03/30/14 0208 04/01/14 0240 04/03/14 0419  WBC 19.4* 15.8* 17.0*  HGB 10.8* 11.1* 11.7*  HCT 31.7* 32.9* 34.1*  PLT 192 248 310   Coag's  Recent Labs Lab 03/29/14 0743  INR 1.22   BMET  Recent Labs Lab 04/01/14 0240 04/02/14 1135 04/03/14 0419  NA 140 138 139  K 4.2 4.6 3.9  CL 99 94* 95*  CO2 30 32 36*  BUN 17 16 15   CREATININE 0.60 0.55  0.61  GLUCOSE 133* 244* 156*   Electrolytes  Recent Labs Lab 03/29/14 0743  04/01/14 0240 04/02/14 1135 04/03/14 0419  CALCIUM 8.8  < > 9.2 9.5 9.3  MG 1.9  --   --   --   --   PHOS 3.0  --   --   --   --   < > = values in this interval not displayed. Sepsis Markers  Recent Labs Lab 03/29/14 0141 03/29/14 0743  LATICACIDVEN 0.88 0.9  PROCALCITON  --  5.43   ABG  Recent Labs Lab 03/29/14 0241 03/29/14 0633 03/30/14 0406  PHART 7.229* 7.276* 7.268*  PCO2ART 57.6* 44.3 59.4*  PO2ART 168.0* 82.0 98.2   Liver Enzymes  Recent Labs Lab 03/29/14 0743  AST 34  ALT 39  ALKPHOS 87  BILITOT 0.7  ALBUMIN 2.6*   Cardiac Enzymes  Recent Labs Lab 03/29/14 0001  TROPONINI <0.30  PROBNP 1730.0*   Glucose  Recent Labs Lab 04/02/14 0324 04/02/14 0757 04/02/14 1201 04/02/14 1706 04/02/14 2107 04/03/14 0736  GLUCAP 111* 134* 266* 236* 202* 142*    Imaging Dg Chest Port 1 View  04/02/2014   CLINICAL DATA:  Pneumonitis, feeling better  EXAM: PORTABLE CHEST - 1 VIEW  COMPARISON:  Portable exam 0642 hr compared to 04/01/2014  FINDINGS: Normal heart size, mediastinal contours and pulmonary vascularity.  Diffuse LEFT lung infiltrate consistent with pneumonia, greater in LEFT upper lobe, slightly improved.  Remaining lungs clear.  Underlying emphysematous changes.  No gross pleural effusion or pneumothorax.  IMPRESSION: COPD changes with slightly improved LEFT lung infiltrate consistent with pneumonia.   Electronically Signed   By: Lavonia Dana M.D.   On: 04/02/2014 08:08      ASSESSMENT / PLAN:  Acute on chronic hypoxic/hypercapnic respiratory failure 2nd to PNA/sepsis and AECOPD. Severe COPD with emphysema. Plan:   Continue advair (dulera while in hospital), spiriva PRN albuterol Adjust oxygen to keep SpO2 > 90% Wean off prednisone as tolerated over next week Bronchial hygiene Will need f/u CXR as outpt (has PCP appt 9/28) Day 7 of Abx >> changed to augmentin  9/21  Sinus tachycardia related to respiratory distress >> resolved. Hx of HTN. Plan:  Resumed lisinopril 9/21  Hx of throat cancer with concern for aspiration as cause of PNA >> speech assessment okay 9/21. Plan: regular diet with thin liquids  Leukocytosis 2nd to PNA, steroids. 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 Plan: SSI Resumed glucophage 9/21  AKI 2nd to hypovolemia - resolved.  Magdalene River, PA-S  [facility]  04/03/2014, 7:46 AM  Reviewed above, examined.  Still having oxygen desaturation with exertion.  Pt is nervous about having to drive RV four hours to get home.  Will keep in hospital overnight, and re-assess whether he is ready for d/c home on 9/23.  Chesley Mires, MD [facility] Pulmonary/Critical Care 04/03/2014, 11:27 AM Pager:  [phone] After 3pm call: [phone]

## 2014-04-03 NOTE — Progress Notes (Signed)
Physical Therapy Treatment Patient Details Name: Brent Powers MRN: [id number] DOB: [birth year] Today's Date: May 03, 2014    History of Present Illness 68 yo male with COPD on nocturnal O2 presented to ED 9/16 with gradual onset SOB and productive cough. In ED was profoundly hypoxic and acidemic. He was placed on BiPAP with little improvement in ABG. He was transferred to [facility] for ICU admission. Pt with PMH of ephysema, COPD, DM, HTN, throat cancer. Pt with necrotizing PNA.    PT Comments    Pt continuing to mobilize well with education for energy conservation, pursed lip breathing and recommendation for personal pulse oximeter to monitor sats. Pt educated for bil LE HEP to increase strength and function. Will continue to follow.   Follow Up Recommendations  No PT follow up     Equipment Recommendations  Other (comment) (rollator)    Recommendations for Other Services       Precautions / Restrictions Precautions Precaution Comments: watch sats    Mobility  Bed Mobility               General bed mobility comments: in recliner on arrival  Transfers Overall transfer level: Modified independent                  Ambulation/Gait Ambulation/Gait assistance: Supervision Ambulation Distance (Feet): 250 Feet Assistive device: Rolling walker (2 wheeled) Gait Pattern/deviations: Step-through pattern;Decreased stride length   Gait velocity interpretation: Below normal speed for age/gender General Gait Details: cues for pursed lip breathing posture in rW, pt felt he needed to continue RW use for stability and energy conservation. Sats 86-92% on 6L with gait with 4 standing rest breaks   Stairs            Wheelchair Mobility    Modified Rankin (Stroke Patients Only)       Balance                                    Cognition Arousal/Alertness: Awake/alert Behavior During Therapy: WFL for tasks assessed/performed Overall Cognitive Status:  Within Functional Limits for tasks assessed                      Exercises General Exercises - Lower Extremity Long Arc Quad: AROM;Both;20 reps;Seated Hip Flexion/Marching: AROM;Both;20 reps;Seated    General Comments        Pertinent Vitals/Pain Pain Assessment: No/denies pain    Home Living                      Prior Function            PT Goals (current goals can now be found in the care plan section) Progress towards PT goals: Progressing toward goals    Frequency       PT Plan Current plan remains appropriate    Co-evaluation             End of Session Equipment Utilized During Treatment: Oxygen Activity Tolerance: Patient tolerated treatment well Patient left: in chair;with call bell/phone within reach;with chair alarm set;with family/visitor present     Time: 1331-1355 PT Time Calculation (min): 24 min  Charges:  $Gait Training: 8-22 mins $Therapeutic Activity: 8-22 mins                    G Codes:      Melford Aase 05/03/2014, 2:04 PM Colbert Ewing  Pam Drown, [place]

## 2014-04-03 NOTE — Discharge Summary (Signed)
Physician Discharge Summary  Patient ID: Brent Powers MRN: [id number] DOB/AGE: 69-Sep-1947 69 y.o.  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  AECOPD LLL PNA Emphysema  Discharge Plan: Complete Augmentin as prescribed below Resume Flonase, Xyzal, Spiriva, Advair Taper steroids to off, 3 more days then stop Continue oxygen at 5 L, will need review upon follow up to determine if the patient continues to need O2 Follow up CXR with PCP as an outpatient to ensure resolution of infiltrate.   Leukocytosis due to PNA/steroids Sepsis  Discharge Plan: Resolved, no acute follow up at time of discharge.   DM II  Discharge Plan: Resume Metformin 1035m PO BID  AKI due to hypovolemia-resolved  Discharge Plan: Resolved, no acute follow up warranted.   Hx HTN Sinus tachycardia secondary to respiratory distress - resolved  Discharge Plan: Resume Lisinopril 146mPO daily  Hx Throat Cancer  Discharge Plan: Follow up with Oncology as previously directed  Acute encephalopathy - resolved  Discharge Plan: Resolved, no acute follow up warranted.                     DISCHARGE SUMMARY   Brent EHRMANNs a 68[birth year]68 y/o male with a PMH of emphysema, DM, HTN, throat cancer, COPD on nocturnal O2 and LLL PNA who [facility] with a one week history of progressive shortness of breath and productive cough.  He had noted a decrease in ability  to perform normal activity and inability to sleep flat.    He was found to be hypoxic and acidemic and was placed on Bipap with little improvement in ABG.  [facility]. He was treated with IV antibiotics for PNA, nebulized bronchodilators and IV steroids for acute exacerbation of COPD.  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  On 9/18, the patient was able to tolerate a reduction of BiPAP to nocturnal use only.  On 9/20, BiPAP was discontinued and he was transferred out of the ICU to a telemetry floor.  Abx were narrowed to augmentin PO for home.  Steroids were tapered.  Subjectively, his breathing & cough improved.  Oxygen needs were assessed  And he continues to have desaturations with movement, therefore he is to remain on home continuous 5L O2.    [place] in [place] area.  He plans to stay in the area with family for the next few days and [place] and [place] up with [place] providers there.  The patient  is medically stable and cleared for discharge with plans as above.              SIGNIFICANT DIAGNOSTIC STUDIES 9/17 CTA chest >> LUL ASD, upper lobe predominant emphysema 9/22 CXR >> LUL, L basilar airspace disease unchanged, underlying emphysema   MICRO DATA  BCx2 9/17>>>negative Sputum 9/17 >>>negative Urine 9/17 >>>negative   ANTIBIOTICS Levaquin 9/17>>9/18 Pip/tazo 9/17>>9/21 Vanc 9/17>> 9/19 Augmentin 9/21>>see Rx below.     Discharge Exam: General: WDWN male sitting in chair, NAD  Neuro: Alert, oriented  HEENT: [place]/AT, no JVD noted  Cardiovascular: S1 s 2  Lungs: Diminished BL, no rales  Abdomen: Soft, non-tender, non-distended  Musculoskeletal: No acute deformity, no edema. +2 peripheral pulses.   Filed Vitals:   04/03/14 2048 04/03/14 2116 04/04/14 0546  04/04/14 0901  BP:  125/66 114/62   Pulse:  90 84 84  Temp:  98.6 F (37 C) 98.5 F (36.9 C)   TempSrc:  Oral Oral   Resp:  18 20   Height:      Weight:      SpO2: 93% 96% 94% 95%     Discharge Labs  BMET  Recent Labs Lab 03/29/14 0001 03/29/14 0743 03/30/14 0208 04/01/14 0240 04/02/14 1135 04/03/14 0419  NA 134* 135* 140 140 138 139  K 4.3 4.4 4.4 4.2 4.6 3.9  CL 92* 97 102 99 94* 95*  CO2 24 19 27 30  32 36*  GLUCOSE 194* 200* 179* 133* 244* 156*  BUN 36* 24* 21 17 16 15   CREATININE 1.50* 0.87 0.70 0.60 0.55 0.61  CALCIUM 10.0 8.8 8.9 9.2 9.5 9.3  MG  --  1.9  --   --   --   --   PHOS  --  3.0  --   --   --   --     CBC  Recent Labs Lab 03/30/14 0208 04/01/14 0240 04/03/14 0419  HGB 10.8* 11.1* 11.7*  HCT 31.7* 32.9* 34.1*  WBC 19.4* 15.8* 17.0*  PLT 192 248 310   Anti-Coagulation  Recent Labs Lab 03/29/14 0743  INR 1.22       Discharge Instructions   Call MD for:  difficulty breathing, headache or visual disturbances    Complete by:  As directed      Call MD for:  extreme fatigue    Complete by:  As directed      Call MD for:  hives    Complete by:  As directed      Call MD for:  persistant dizziness or light-headedness    Complete by:  As directed      Call MD for:  persistant nausea and vomiting    Complete by:  As directed      Call MD for:  severe uncontrolled pain    Complete by:  As directed      Call MD for:  temperature >100.4    Complete by:  As directed      Diet - low sodium heart healthy    Complete by:  As directed      Discharge instructions    Complete by:  As directed   1. Call to make an appointment to be seen in one week by your Primary Care doctor and / or Pulmonologist  2. You will need a follow up chest xray to review your pneumonia 3.  Continue your oxygen at 5L per nasal cannula continuously     Increase activity slowly  Complete by:  As directed           DISCHARGE MEDICATIONS     Medication List     STOP taking these medications       amoxicillin 500 MG capsule  Commonly known as:  AMOXIL      TAKE these medications       amoxicillin-clavulanate 875-125 MG per tablet  Commonly known as:  AUGMENTIN  Take 1 tablet by mouth every 12 (twelve) hours.     dorzolamide 2 % ophthalmic solution  Commonly known as:  TRUSOPT  Place 1 drop into both eyes at bedtime.     Fish Oil 1000 MG Caps  Take 1,000 mg by mouth at bedtime.     fluticasone 50 MCG/ACT nasal spray  Commonly known as:  FLONASE  Place 2 sprays into both nostrils daily as needed (congestion).     Fluticasone-Salmeterol 250-50 MCG/DOSE Aepb  Commonly known as:  ADVAIR  Inhale 1 puff into the lungs 2 (two) times daily.     levocetirizine 5 MG tablet  Commonly known as:  XYZAL  Take 5 mg by mouth daily as needed for allergies.     lisinopril 10 MG tablet  Commonly known as:  PRINIVIL,ZESTRIL  Take 10 mg by mouth daily.     metFORMIN 1000 MG tablet  Commonly known as:  GLUCOPHAGE  Take 1,000 mg by mouth 2 (two) times daily with a meal.     predniSONE 5 MG tablet  Commonly known as:  DELTASONE  Take 1 tablet (5 mg total) by mouth daily with breakfast.     tiotropium 18 MCG inhalation capsule  Commonly known as:  SPIRIVA  Place 18 mcg into inhaler and inhale daily.        Follow-up Information   Follow up with Dr. Vonzella Nipple . (Call to make an appointment to be seen within one week.  You will need a follow up chest xray to ensure pneumonia is resolved. )    Contact information:   [address], [place] [address] [phone]       Disposition: Home.  Arranged for patient to have O2 for travel home out of state.  He will need to follow up with PCP.  Attempted to schedule an appointment for the patient at discharge but office policy would not allow a provider to make an appointment.    Discharged Condition: YUSSEF JORGE has met maximum benefit of inpatient care and is medically  stable and cleared for discharge.  Patient is pending follow up as above.      Time spent on disposition:  Greater than 35 minutes.   Signed:  Noe Gens, NP-C [facility] Pgr: [phone] or [phone]  Chesley Mires, MD [place] 04/04/2014, 10:36 AM Pager:  [phone] After 3pm call: [phone]

## 2014-04-04 DIAGNOSIS — J9620 Acute and chronic respiratory failure, unspecified whether with hypoxia or hypercapnia: Secondary | ICD-10-CM

## 2014-04-04 DIAGNOSIS — R0902 Hypoxemia: Secondary | ICD-10-CM

## 2014-04-04 LAB — CULTURE, BLOOD (ROUTINE X 2)
Culture: NO GROWTH
Culture: NO GROWTH

## 2014-04-04 LAB — GLUCOSE, CAPILLARY
GLUCOSE-CAPILLARY: 133 mg/dL — AB (ref 70–99)
Glucose-Capillary: 143 mg/dL — ABNORMAL HIGH (ref 70–99)

## 2014-04-04 MED ORDER — PREDNISONE 5 MG PO TABS: 5.0000 mg | ORAL_TABLET | Freq: Every day | ORAL | Status: AC

## 2014-04-04 MED ORDER — AMOXICILLIN-POT CLAVULANATE 875-125 MG PO TABS: 1.0000 | ORAL_TABLET | Freq: Two times a day (BID) | ORAL | Status: AC

## 2014-04-04 NOTE — Progress Notes (Signed)
PULMONARY / CRITICAL CARE MEDICINE   Name: Brent Powers MRN: [id number] DOB: 1946-04-22    ADMISSION DATE:  03/28/2014  REFERRING MD :  [facility]  CHIEF COMPLAINT:  Dyspnea  INITIAL PRESENTATION:  68 yo male presented with progressive dyspnea, productive cough, hypoxia from LUL PNA.  He has hx of COPD on home oxygen followed by pulmonary in [place].  STUDIES:  9/17 CTA chest >> LUL ASD, upper lobe predominant emphysema  SIGNIFICANT EVENTS: 9/17  admitted to ICU on BiPAP 9/18  bipap noct 9/20  Transferred to tele  SUBJECTIVE:  Breathing and cough continue to improve.  Still needing supplemental oxygen during the day.  Anxious to go home.  VITAL SIGNS: Temp:  [98.5 F (36.9 C)-98.9 F (37.2 C)] 98.5 F (36.9 C) (09/23 0546) Pulse Rate:  [84-98] 84 (09/23 0546) Resp:  [18-20] 20 (09/23 0546) BP: (112-125)/(62-68) 114/62 mmHg (09/23 0546) SpO2:  [92 %-96 %] 94 % (09/23 0546) INTAKE / OUTPUT:  Intake/Output Summary (Last 24 hours) at 04/04/14 0723 Last data filed at 04/03/14 1129  Gross per 24 hour  Intake    120 ml  Output    200 ml  Net    -80 ml    PHYSICAL EXAMINATION: General:  WDWN male sitting in chair, NAD Neuro:  Alert, oriented HEENT:  [place]/AT, no JVD noted Cardiovascular:  S1 s 2 Lungs:  Diminished BL, no rales Abdomen:  Soft, non-tender, non-distended Musculoskeletal:  No acute deformity, min edema. +2 peripheral pulses.  Skin:  Intact  LABS:  CBC  Recent Labs Lab 03/30/14 0208 04/01/14 0240 04/03/14 0419  WBC 19.4* 15.8* 17.0*  HGB 10.8* 11.1* 11.7*  HCT 31.7* 32.9* 34.1*  PLT 192 248 310   Coag's  Recent Labs Lab 03/29/14 0743  INR 1.22   BMET  Recent Labs Lab 04/01/14 0240 04/02/14 1135 04/03/14 0419  NA 140 138 139  K 4.2 4.6 3.9  CL 99 94* 95*  CO2 30 32 36*  BUN 17 16 15   CREATININE 0.60 0.55 0.61  GLUCOSE 133* 244* 156*   Electrolytes  Recent Labs Lab 03/29/14 0743  04/01/14 0240 04/02/14 1135 04/03/14 0419   CALCIUM 8.8  < > 9.2 9.5 9.3  MG 1.9  --   --   --   --   PHOS 3.0  --   --   --   --   < > = values in this interval not displayed. Sepsis Markers  Recent Labs Lab 03/29/14 0141 03/29/14 0743  LATICACIDVEN 0.88 0.9  PROCALCITON  --  5.43   ABG  Recent Labs Lab 03/29/14 0241 03/29/14 0633 03/30/14 0406  PHART 7.229* 7.276* 7.268*  PCO2ART 57.6* 44.3 59.4*  PO2ART 168.0* 82.0 98.2   Liver Enzymes  Recent Labs Lab 03/29/14 0743  AST 34  ALT 39  ALKPHOS 87  BILITOT 0.7  ALBUMIN 2.6*   Cardiac Enzymes  Recent Labs Lab 03/29/14 0001  TROPONINI <0.30  PROBNP 1730.0*   Glucose  Recent Labs Lab 04/02/14 1706 04/02/14 2107 04/03/14 0736 04/03/14 1128 04/03/14 1627 04/03/14 2054  GLUCAP 236* 202* 142* 144* 149* 161*    Imaging Dg Chest Port 1 View  04/03/2014   CLINICAL DATA:  Cough.  Hypoxia.  Pneumonia.  EXAM: PORTABLE CHEST - 1 VIEW  COMPARISON:  Plain films the chest 03/29/2014, 04/01/2014 and 04/02/2014. CT chest 03/29/2014.  FINDINGS: The lungs are emphysematous. Airspace disease in the left upper and left lower lobes is again seen and does  not appear notably changed since the most recent plain film. The right lung remains clear. Heart size is normal.  IMPRESSION: Left upper and left basilar airspace disease is unchanged since yesterday's examination.  Emphysema.   Electronically Signed   By: Inge Rise M.D.   On: 04/03/2014 08:18      ASSESSMENT / PLAN:  Acute on chronic hypoxic/hypercapnic respiratory failure 2nd to PNA/sepsis and AECOPD. Severe COPD with emphysema. Plan:   Continue advair (dulera while in hospital), spiriva PRN albuterol Adjust oxygen to keep SpO2 > 90% Wean off prednisone as tolerated over next week Bronchial hygiene Will need f/u CXR as outpt (has PCP appt 9/28) Day 7 of Abx >> changed to augmentin 9/21  Sinus tachycardia related to respiratory distress >> resolved. Hx of HTN. Plan:  Resumed lisinopril 9/21  Hx  of throat cancer with concern for aspiration as cause of PNA >> speech assessment okay 9/21. Plan: regular diet with thin liquids  Leukocytosis 2nd to PNA, steroids. 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 Plan: SSI Resumed glucophage 9/21  AKI 2nd to hypovolemia - resolved.  Plans have changed as pt will be d/c'd to his in-law's home in [place] for a few days before returning to [place].  Daughter plans to meet him there and drive him back as he continues to require 5L O2 to maintain sats in low 90's.  Arrangements have been made for O2 needs.  Magdalene River, PA-S  [facility]  04/04/2014, 7:23 AM

## 2014-04-04 NOTE — Progress Notes (Signed)
Reviewed above.  Pt ready for d/c home today.

## 2014-04-04 NOTE — Progress Notes (Addendum)
Pt up in chair, continues to have low 02 saturation 74% while standing at [facility] and bed bath, recovered quickly once seated to 94%,  wearing 02 BNC 5 liters, respirations even and unlabored at present, denies pain or discomfort, states he is feeling much better today, appetite good, 2+ edema noted to bilateral lower extremities, call light in reach, nurses phone number visible, reminded pt to call nurse for any needs, verbalized understanding, denies needs at this time.  Edward Qualia RN

## 2014-06-12 DIAGNOSIS — C140 Malignant neoplasm of pharynx, unspecified: Secondary | ICD-10-CM

## 2014-06-12 HISTORY — DX: Malignant neoplasm of pharynx, unspecified: C14.0

## 2015-09-02 IMAGING — CR DG CHEST 1V PORT
1 series · 1 of 1 positions shown · non-contrast
Comparison: Portable exam 9441 hr compared to 04/01/2014

CLINICAL DATA: Pneumonitis, feeling better

EXAM:
PORTABLE CHEST - 1 VIEW

[AP]
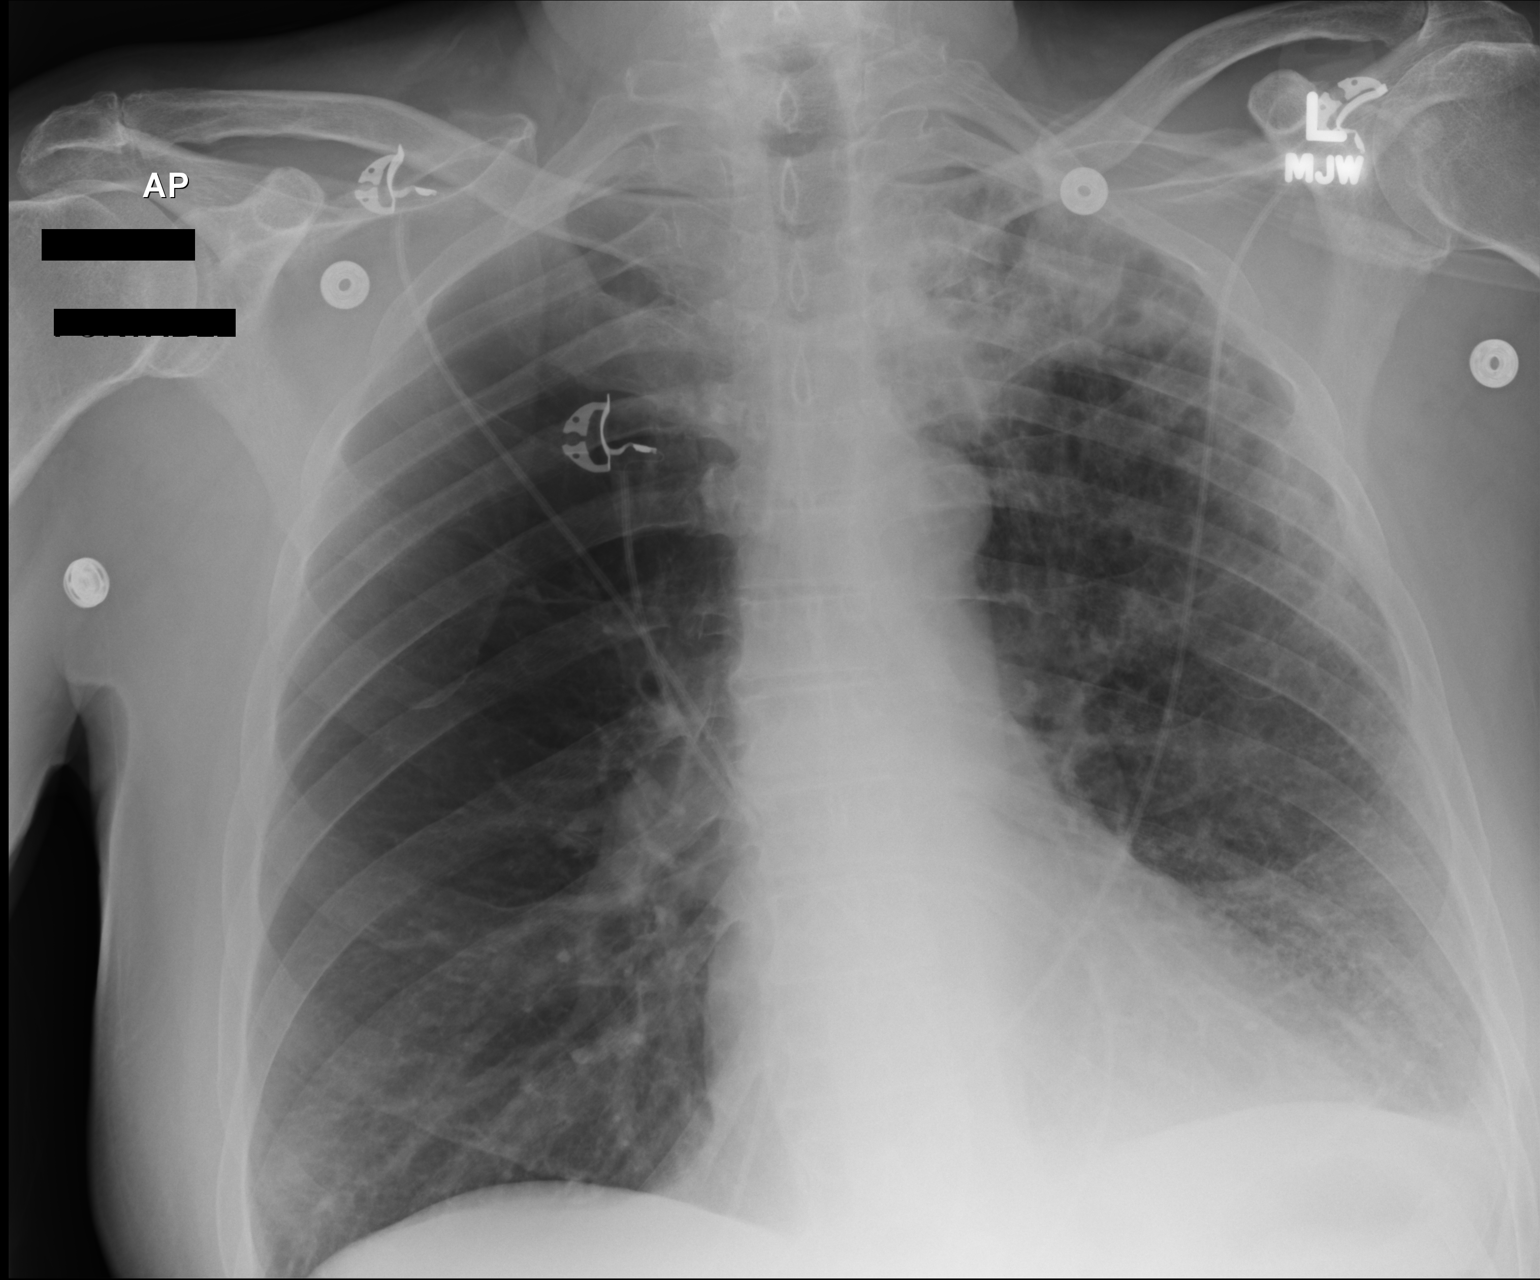

[1 of 1 positions shown; findings below may reference images not displayed]

FINDINGS: Normal heart size, mediastinal contours and pulmonary vascularity.

Diffuse LEFT lung infiltrate consistent with pneumonia, greater in
LEFT upper lobe, slightly improved.

Remaining lungs clear.

Underlying emphysematous changes.

No gross pleural effusion or pneumothorax.
IMPRESSION: COPD changes with slightly improved LEFT lung infiltrate consistent
with pneumonia.

## 2015-09-03 IMAGING — CR DG CHEST 1V PORT
2 series · 2 of 2 positions shown · non-contrast
Comparison: Plain films the chest 03/29/2014, 04/01/2014 and
04/02/2014. CT chest 03/29/2014.

CLINICAL DATA: Cough.  Hypoxia.  Pneumonia.

EXAM:
PORTABLE CHEST - 1 VIEW

[AP (1 of 2)]
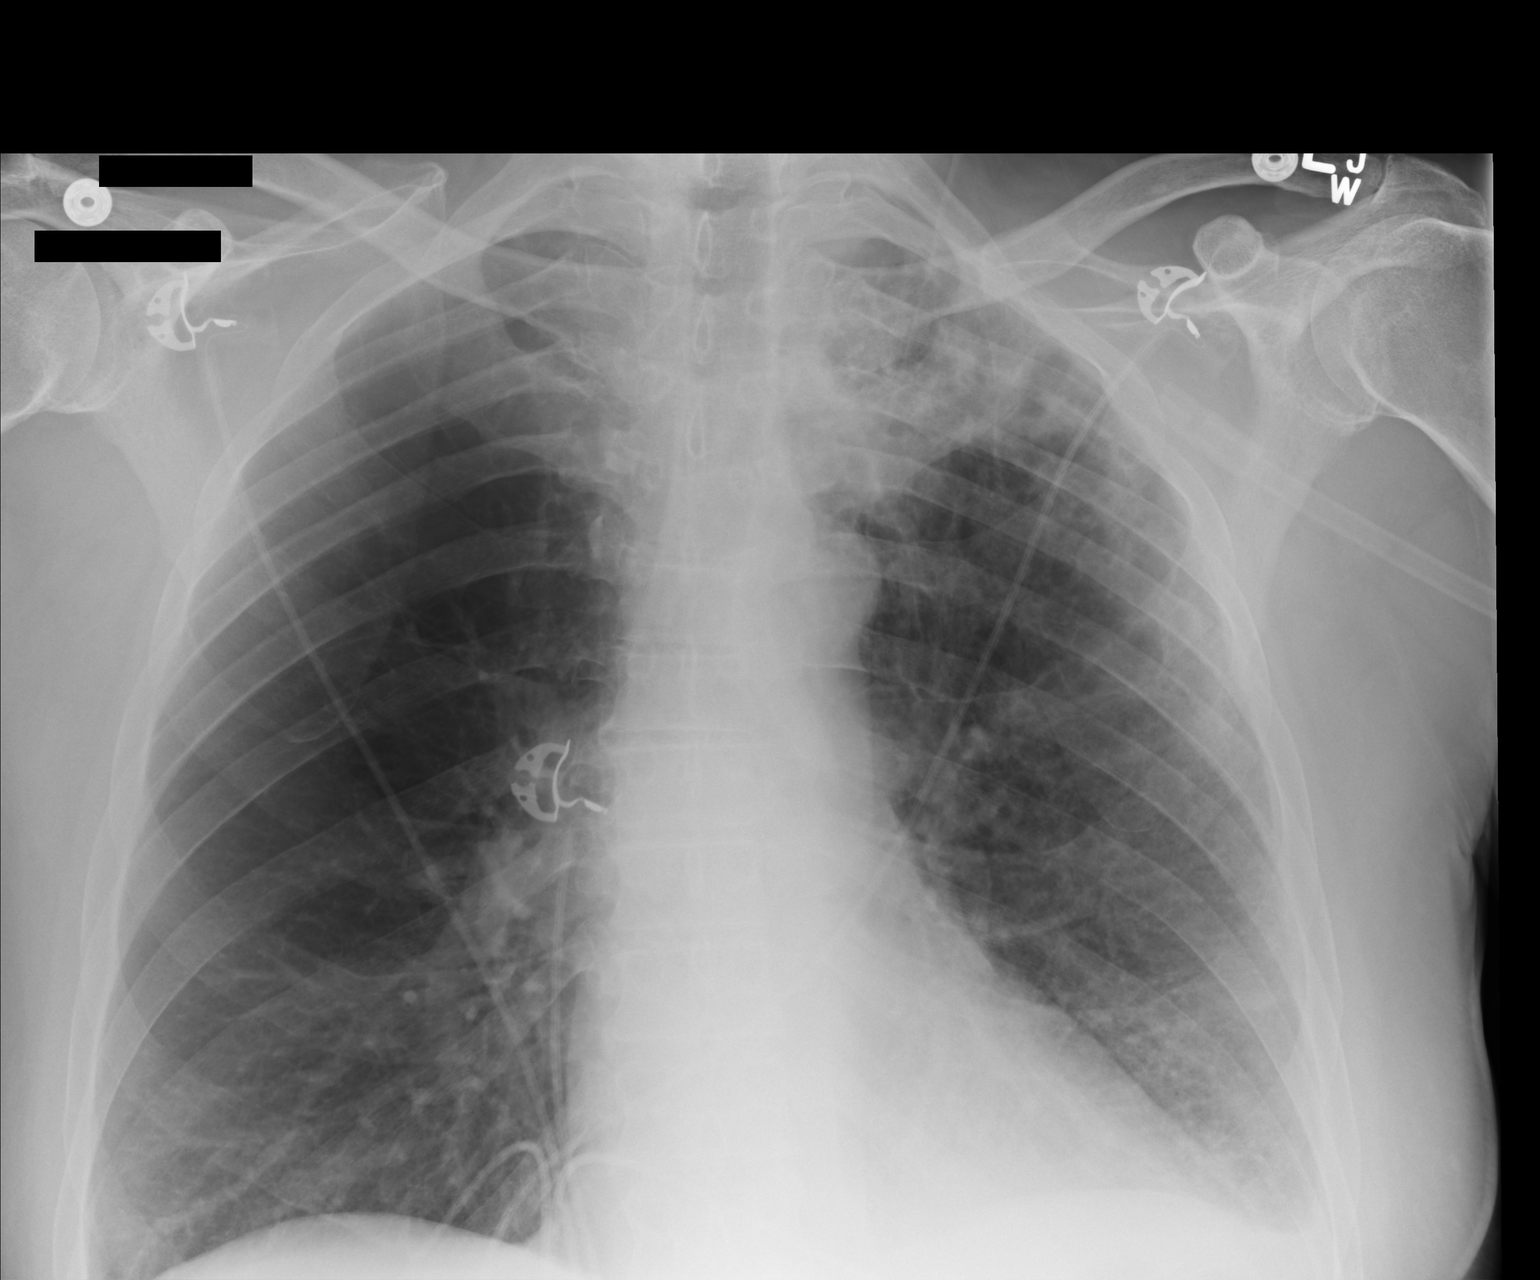

[AP (2 of 2)]
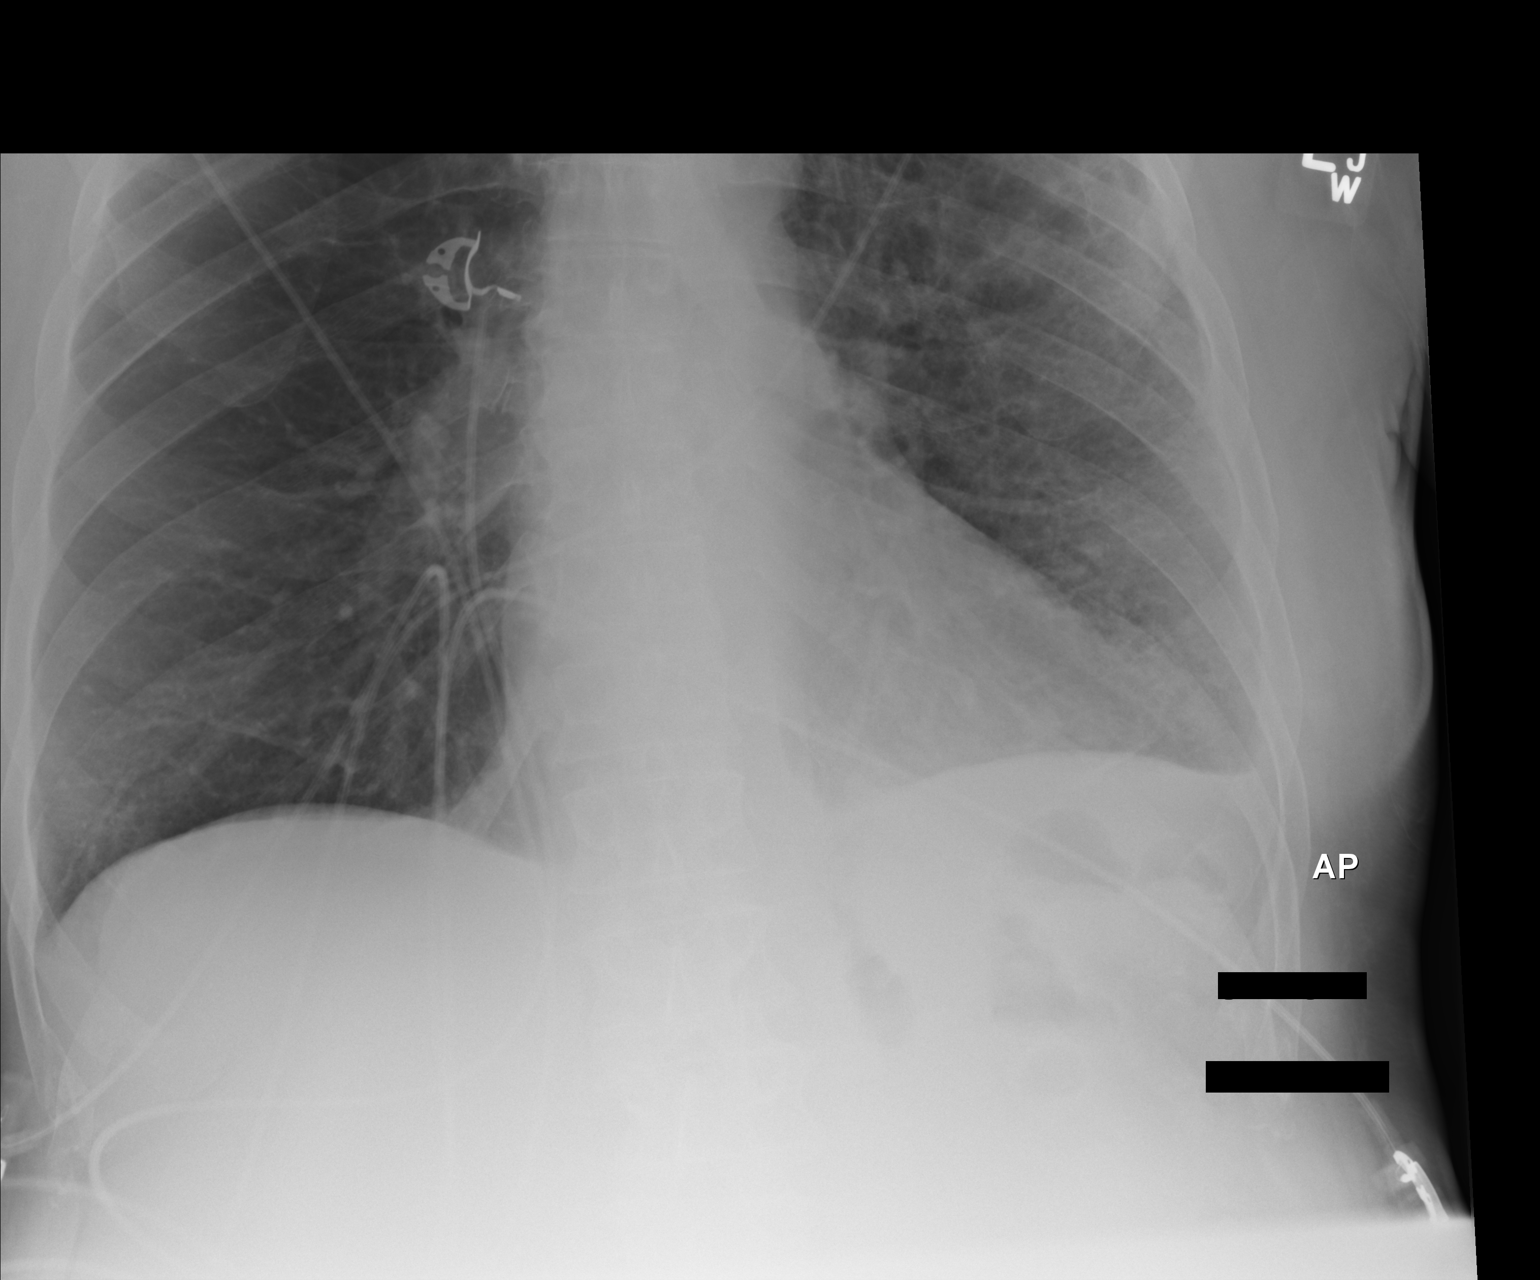

[2 of 2 positions shown; findings below may reference images not displayed]

FINDINGS: The lungs are emphysematous. Airspace disease in the left upper and
left lower lobes is again seen and does not appear notably changed
since the most recent plain film. The right lung remains clear.
Heart size is normal.
IMPRESSION: Left upper and left basilar airspace disease is unchanged since
yesterday's examination.

Emphysema.

## 2019-06-13 DEATH — deceased
